# Patient Record
Sex: Female | Born: 1990 | Hispanic: Yes | Marital: Married | Smoking: Never smoker
Health system: Southern US, Community
[De-identification: ages and names within clinical notes are randomized; demographics above are authoritative.]

## PROBLEM LIST (undated history)

## (undated) DIAGNOSIS — Z789 Other specified health status: Secondary | ICD-10-CM

## (undated) HISTORY — PX: OTHER SURGICAL HISTORY: SHX169

## (undated) HISTORY — DX: Other specified health status: Z78.9

---

## 2013-08-22 ENCOUNTER — Ambulatory Visit: Payer: Self-pay

## 2016-08-23 ENCOUNTER — Emergency Department
Admission: EM | Admit: 2016-08-23 | Discharge: 2016-08-24 | Disposition: A | Discharge status: Home / Self Care | Payer: Self-pay | Attending: Emergency Medicine | Admitting: Emergency Medicine

## 2016-08-23 ENCOUNTER — Emergency Department: Payer: Self-pay

## 2016-08-23 ENCOUNTER — Encounter: Payer: Self-pay | Admitting: *Deleted

## 2016-08-23 DIAGNOSIS — D509 Iron deficiency anemia, unspecified: Secondary | ICD-10-CM | POA: Insufficient documentation

## 2016-08-23 DIAGNOSIS — R0789 Other chest pain: Secondary | ICD-10-CM | POA: Insufficient documentation

## 2016-08-23 DIAGNOSIS — Z87891 Personal history of nicotine dependence: Secondary | ICD-10-CM | POA: Insufficient documentation

## 2016-08-23 LAB — BASIC METABOLIC PANEL
ANION GAP: 7 (ref 5–15)
BUN: 17 mg/dL (ref 6–20)
CHLORIDE: 109 mmol/L (ref 101–111)
CO2: 22 mmol/L (ref 22–32)
CREATININE: 0.64 mg/dL (ref 0.44–1.00)
Calcium: 9.2 mg/dL (ref 8.9–10.3)
GFR calc non Af Amer: >60 mL/min (ref >60)
Glucose, Bld: 103 mg/dL — ABNORMAL HIGH (ref 65–99)
Potassium: 4.2 mmol/L (ref 3.5–5.1)
SODIUM: 138 mmol/L (ref 135–145)

## 2016-08-23 LAB — CBC
HCT: 30.4 % — ABNORMAL LOW (ref 35.0–47.0)
HEMOGLOBIN: 9.4 g/dL — AB (ref 12.0–16.0)
MCH: 20.4 pg — AB (ref 26.0–34.0)
MCHC: 30.9 g/dL — ABNORMAL LOW (ref 32.0–36.0)
MCV: 65.8 fL — AB (ref 80.0–100.0)
PLATELETS: 272 10*3/uL (ref 150–440)
RBC: 4.62 MIL/uL (ref 3.80–5.20)
RDW: 18.2 % — ABNORMAL HIGH (ref 11.5–14.5)
WBC: 9.8 10*3/uL (ref 3.6–11.0)

## 2016-08-23 LAB — TROPONIN I

## 2016-08-23 NOTE — ED Triage Notes (Signed)
Pt to triage via wheelchair.  Pt has chest pain and sob.  Began this afternoon.  Pt also reports nausea.   Pt tearful in triage.  Pt alert.  Interpreter with pt

## 2016-08-23 NOTE — Discharge Instructions (Signed)
Fortunately today your blood work and chest x-ray were normal. Please make an appointment to establish care with a primary care physician within the next week or so for recheck. Return to the emergency department for any new or worsening symptoms such as if your pain becomes stronger, if he developed fevers or chills, or for any other issues whatsoever.  It was a pleasure to take care of you today, and thank you for coming to our emergency department.  If you have any questions or concerns before leaving please ask the nurse to grab me and I'm more than happy to go through your aftercare instructions again.  If you were prescribed any opioid pain medication today such as Norco, Vicodin, Percocet, morphine, hydrocodone, or oxycodone please make sure you do not drive when you are taking this medication as it can alter your ability to drive safely.  If you have any concerns once you are home that you are not improving or are in fact getting worse before you can make it to your follow-up appointment, please do not hesitate to call 911 and come back for further evaluation.  Merrily BrittleNeil Dominick Morella, MD  Results for orders placed or performed during the hospital encounter of 08/23/16  Basic metabolic panel  Result Value Ref Range   Sodium 138 135 - 145 mmol/L   Potassium 4.2 3.5 - 5.1 mmol/L   Chloride 109 101 - 111 mmol/L   CO2 22 22 - 32 mmol/L   Glucose, Bld 103 (H) 65 - 99 mg/dL   BUN 17 6 - 20 mg/dL   Creatinine, Ser 3.320.64 0.44 - 1.00 mg/dL   Calcium 9.2 8.9 - 95.110.3 mg/dL   GFR calc non Af Amer >60 >60 mL/min   GFR calc Af Amer >60 >60 mL/min   Anion gap 7 5 - 15  CBC  Result Value Ref Range   WBC 9.8 3.6 - 11.0 K/uL   RBC 4.62 3.80 - 5.20 MIL/uL   Hemoglobin 9.4 (L) 12.0 - 16.0 g/dL   HCT 88.430.4 (L) 16.635.0 - 06.347.0 %   MCV 65.8 (L) 80.0 - 100.0 fL   MCH 20.4 (L) 26.0 - 34.0 pg   MCHC 30.9 (L) 32.0 - 36.0 g/dL   RDW 01.618.2 (H) 01.011.5 - 93.214.5 %   Platelets 272 150 - 440 K/uL  Troponin I  Result Value Ref  Range   Troponin I <0.03 <0.03 ng/mL   Dg Chest 2 View  Result Date: 08/23/2016 CLINICAL DATA:  Acute onset of generalized chest pain, shortness of breath and nausea. Initial encounter. EXAM: CHEST  2 VIEW COMPARISON:  None. FINDINGS: The lungs are well-aerated and clear. There is no evidence of focal opacification, pleural effusion or pneumothorax. The heart is normal in size; the mediastinal contour is within normal limits. No acute osseous abnormalities are seen. IMPRESSION: No acute cardiopulmonary process seen. Electronically Signed   By: Roanna RaiderJeffery  Chang M.D.   On: 08/23/2016 23:31

## 2016-08-23 NOTE — ED Provider Notes (Signed)
[facility] Emergency Department Provider Note  ____________________________________________   First MD Initiated Contact with Patient 08/23/16 2302     (approximate)  I have reviewed the triage vital signs and the nursing notes.   HISTORY  Chief Complaint Chest Pain   HPI Leah Ayers is a 26 y.o. female who self presents to the emergency department withatypical chest pain that began roughly 2 hours prior to arrival at 7:30 PM today. The pain began when she was at work. The pain is sharp in her left lateral chest and worse with deep inspiration. It is nonexertional. She's had no cough. She did have a mild sore throat this morning. No fevers or chills. No leg swelling. No history of DVT or pulmonary embolism. No sick contacts. She is currently in no pain.   No past medical history on file.  There are no active problems to display for this patient.   No past surgical history on file.  Prior to Admission medications   Not on File    Allergies Patient has no known allergies.  No family history on file.  Social History Social History  Substance Use Topics  . Smoking status: Former Games developer  . Smokeless tobacco: Never Used  . Alcohol use No    Review of Systems Constitutional: No fever/chills ENT: Positive sore throat. Cardiovascular: Positive chest pain. Respiratory: Positive shortness of breath. Gastrointestinal: No abdominal pain.  No nausea, no vomiting.  No diarrhea.  No constipation. Musculoskeletal: Negative for back pain. Neurological: Negative for headaches   ____________________________________________   PHYSICAL EXAM:  VITAL SIGNS: ED Triage Vitals  Enc Vitals Group     BP 08/23/16 2131 (!) 100/57     Pulse Rate 08/23/16 2131 76     Resp 08/23/16 2131 20     Temp 08/23/16 2131 99.1 F (37.3 C)     Temp Source 08/23/16 2131 Oral     SpO2 08/23/16 2131 99 %     Weight 08/23/16 2127 120 lb (54.4 kg)     Height  08/23/16 2127 5\' 4"  (1.626 m)     Head Circumference --      Peak Flow --      Pain Score 08/23/16 2126 8     Pain Loc --      Pain Edu? --      Excl. in GC? --     Constitutional: Alert and oriented 4 very well-appearing nontoxic no diaphoresis speaks in full clear sentences Head: Atraumatic. Nose: No congestion/rhinnorhea. Mouth/Throat: No trismus no pharyngeal erythema or exudate Neck: No stridor.   Cardiovascular: Regular rate and rhythm no murmurs focally tender over left upper chest is Respiratory: Normal respiratory effort.  No retractions. Gastrointestinal: Soft nondistended nontender no rebound or guarding no peritonitis Neurologic:  Normal speech and language. No gross focal neurologic deficits are appreciated.  Skin:  Skin is warm, dry and intact. No rash noted.    ____________________________________________  LABS (all labs ordered are listed, but only abnormal results are displayed)  Labs Reviewed  BASIC METABOLIC PANEL - Abnormal; Notable for the following:       Result Value   Glucose, Bld 103 (*)    All other components within normal limits  CBC - Abnormal; Notable for the following:    Hemoglobin 9.4 (*)    HCT 30.4 (*)    MCV 65.8 (*)    MCH 20.4 (*)    MCHC 30.9 (*)    RDW 18.2 (*)  All other components within normal limits  TROPONIN I    No signs of acute ischemia Slightly low hemoglobin with low MCV and high RDW consistent with iron deficiency __________________________________________  EKG  ED ECG REPORT I, Merrily BrittleNeil Samyak Sackmann, the attending physician, personally viewed and interpreted this ECG.  Date: 08/23/2016 Rate: 90 Rhythm: normal sinus rhythm QRS Axis: Rightward axis Intervals: normal ST/T Wave abnormalities: normal Narrative Interpretation: Borderline EKG  ____________________________________________  RADIOLOGY  Chest x-ray with no acute disease ____________________________________________   PROCEDURES  Procedure(s)  performed: no  Procedures  Critical Care performed: no  Observation: no ____________________________________________   INITIAL IMPRESSION / ASSESSMENT AND PLAN / ED COURSE  Pertinent labs & imaging results that were available during my care of the patient were reviewed by me and considered in my medical decision making (see chart for details).  The patient is very well-appearing and currently in no pain. She did have some pleuritic chest pain and shortness of breath with a slightly rightward axis which raises the concern for pulmonary embolism however she is low risk and she is PERC negative.  Her hemoglobin is 10 with a low MCV and high RDW which is consistent with iron deficiency and likely secondary to heavy menses. I'll refer her to trust her clinic to establish care with a primary care physician and strict return precautions given. She is discharged home in good condition.      ____________________________________________   FINAL CLINICAL IMPRESSION(S) / ED DIAGNOSES  Final diagnoses:  Atypical chest pain  Iron deficiency anemia, unspecified iron deficiency anemia type      NEW MEDICATIONS STARTED DURING THIS VISIT:  New Prescriptions   No medications on file     Note:  This document was prepared using Dragon voice recognition software and may include unintentional dictation errors.      Merrily Brittleifenbark, Mcgwire Dasaro, MD 08/23/16 2351

## 2018-10-17 IMAGING — CR DG CHEST 2V
2 series · 2 of 2 positions shown · non-contrast
Comparison: None.

CLINICAL DATA: Acute onset of generalized chest pain, shortness of
breath and nausea. Initial encounter.

EXAM:
CHEST  2 VIEW

[chest pa]
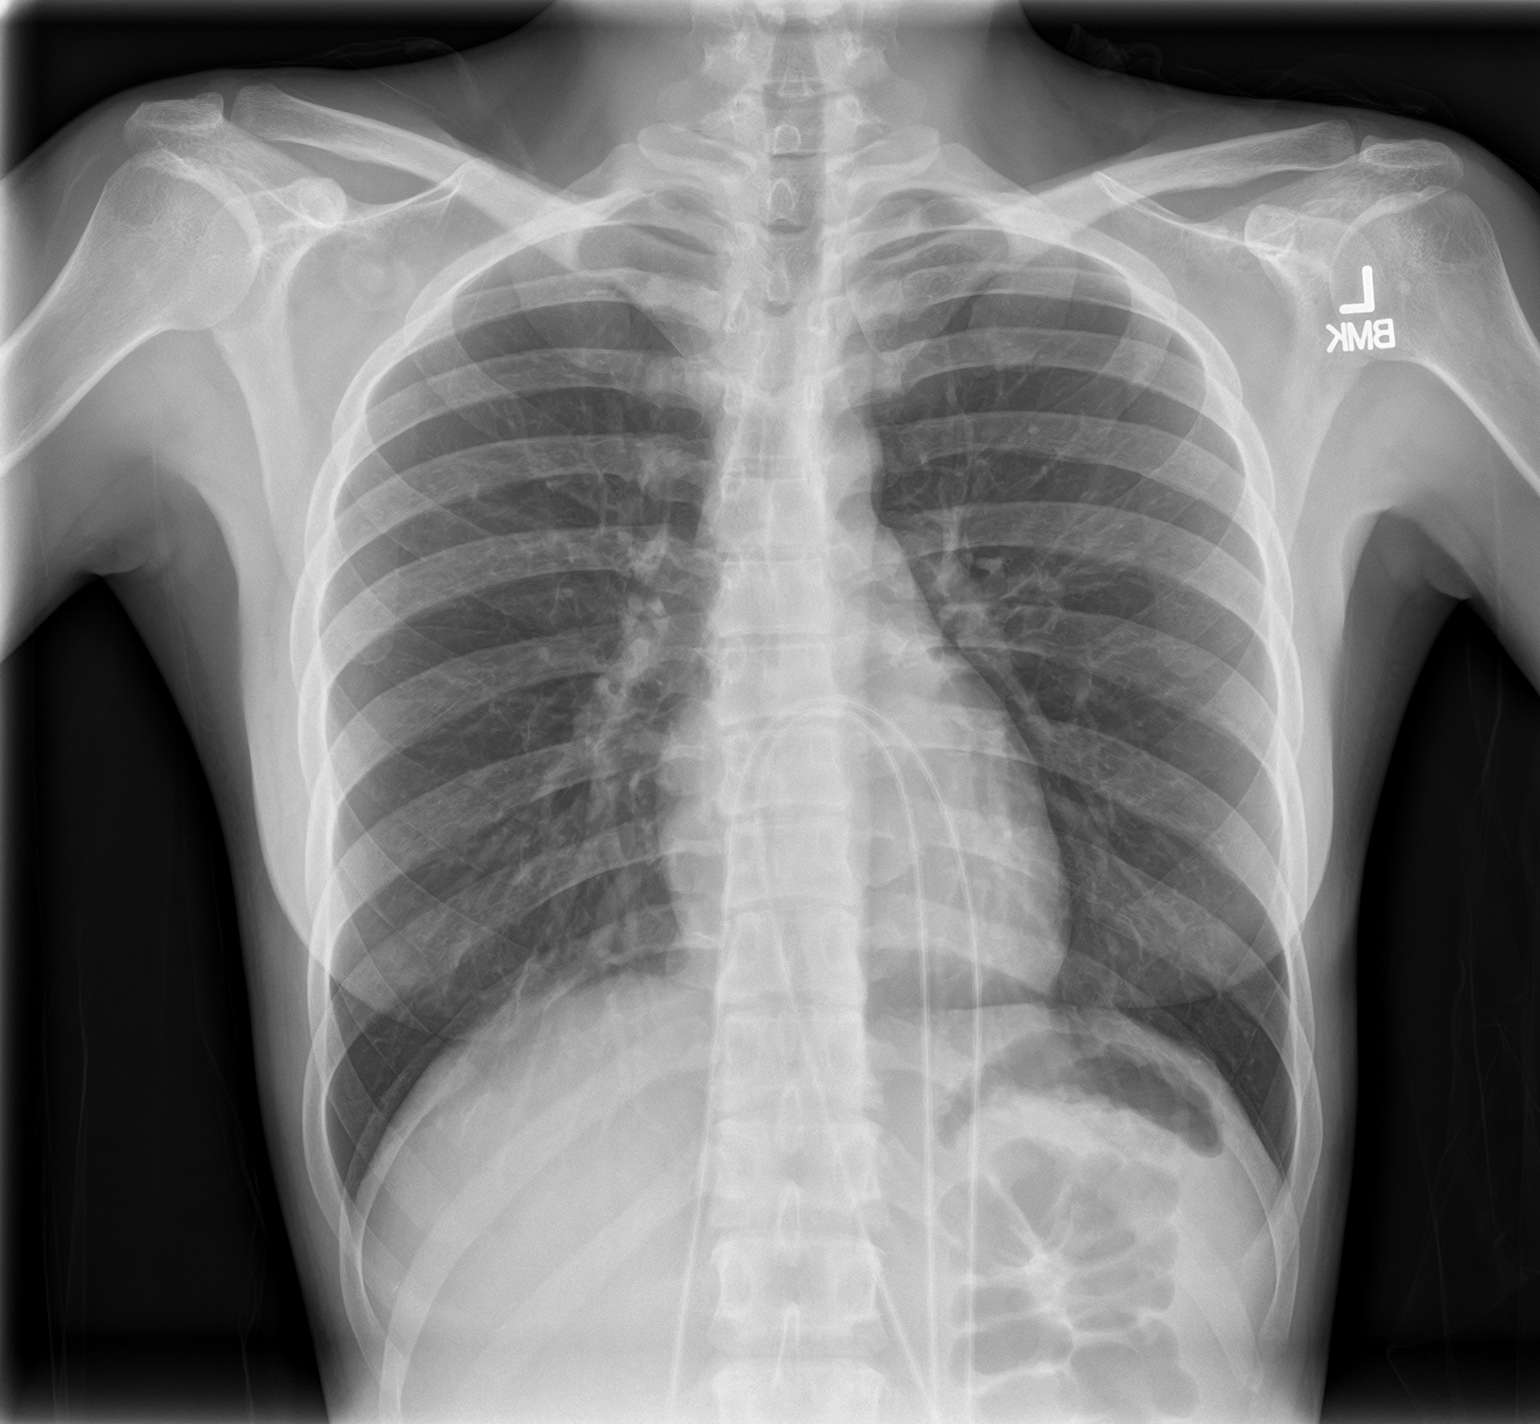

[chest lat]
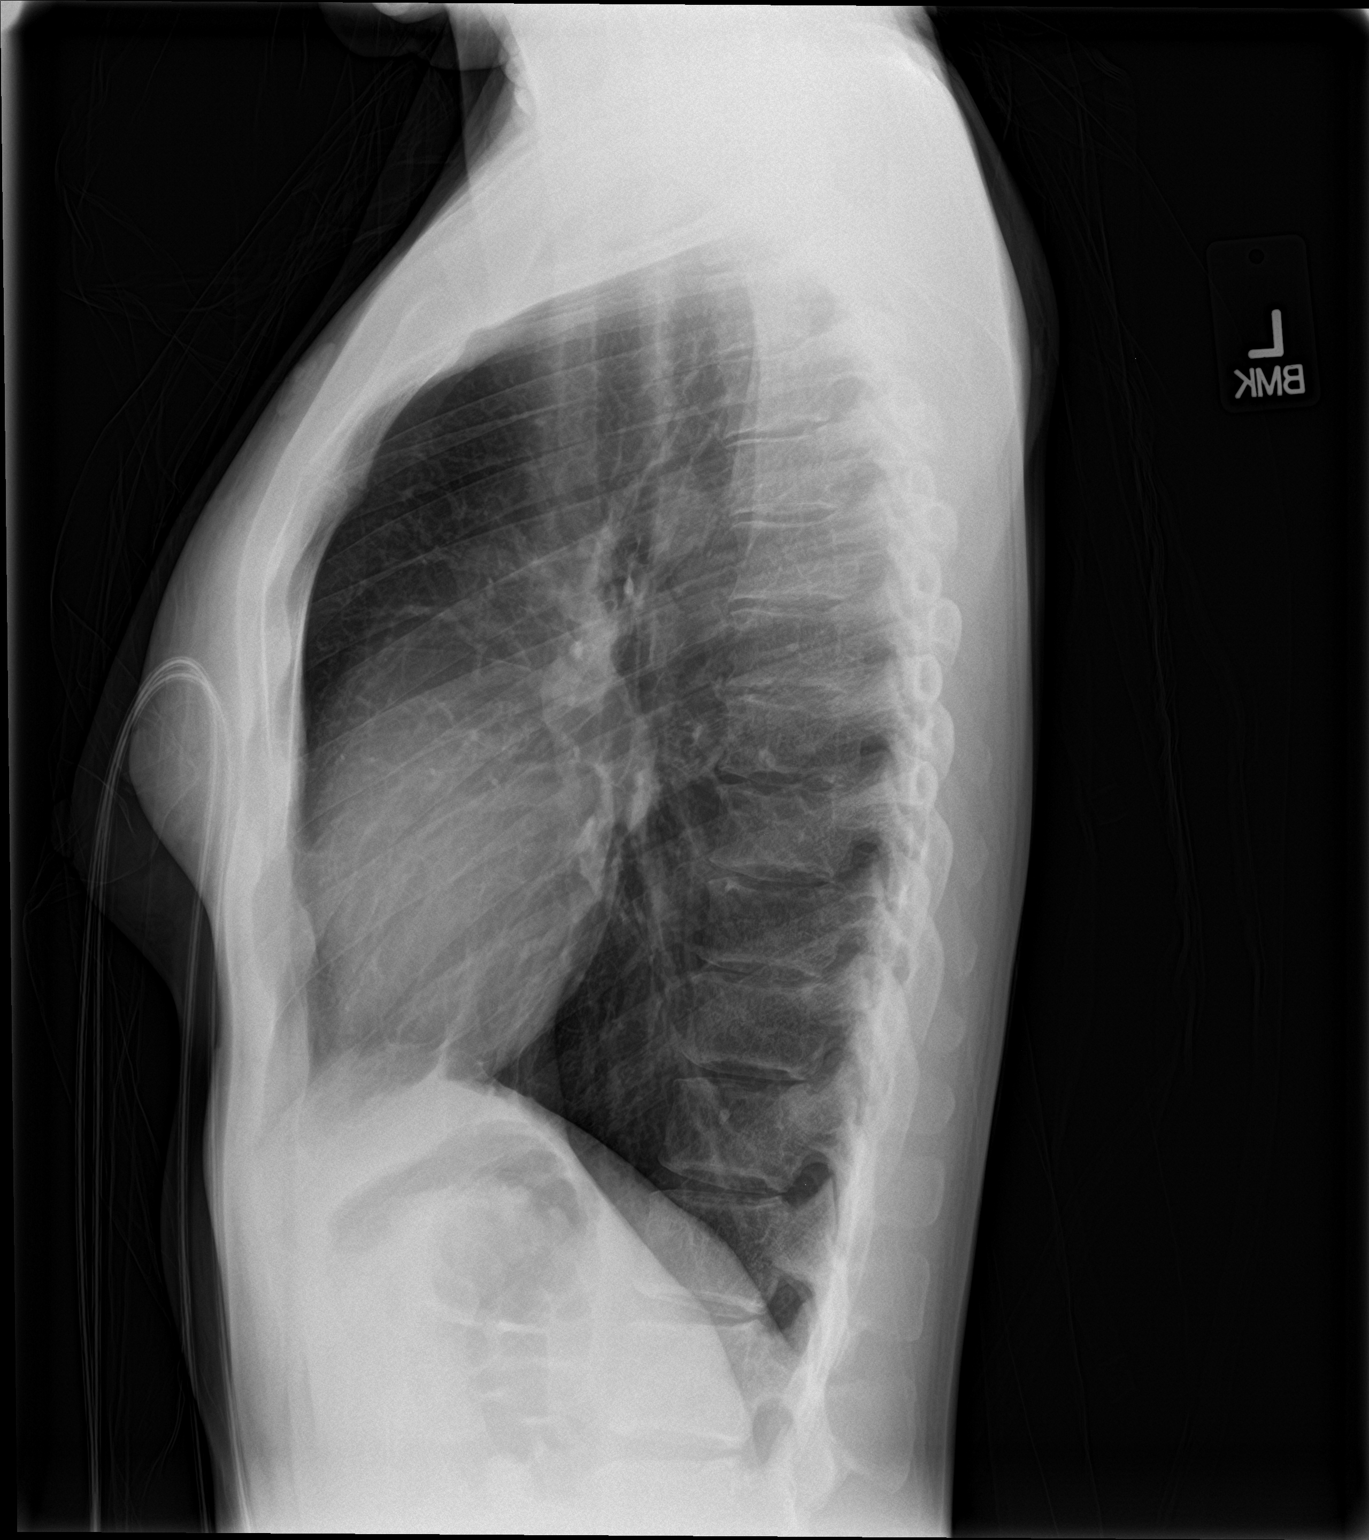

[2 of 2 positions shown; findings below may reference images not displayed]

FINDINGS: The lungs are well-aerated and clear. There is no evidence of focal
opacification, pleural effusion or pneumothorax.

The heart is normal in size; the mediastinal contour is within
normal limits. No acute osseous abnormalities are seen.
IMPRESSION: No acute cardiopulmonary process seen.

## 2019-11-21 ENCOUNTER — Other Ambulatory Visit: Payer: Self-pay

## 2019-11-21 ENCOUNTER — Ambulatory Visit (LOCAL_COMMUNITY_HEALTH_CENTER): Payer: Medicaid Other

## 2019-11-21 VITALS — BP 105/59 | Ht 62.0 in | Wt 106.5 lb

## 2019-11-21 DIAGNOSIS — Z3202 Encounter for pregnancy test, result negative: Secondary | ICD-10-CM

## 2019-11-21 LAB — PREGNANCY, URINE: Preg Test, Ur: NEGATIVE

## 2019-11-21 NOTE — Progress Notes (Signed)
UPT negative today. Sister present during visit. Lang. Line used as interpreter. Reports trying to conceive for 11 yrs. Home preg test positve. Reports n/v, abd pain. Denies bleeding. Taking mvi. LMP 11/03/2019.  Advised pt to follow up with PCP to evaluate symptoms. Local community provider list given and explained. Advised to seek immediate medical attn. For increased abd pain and bleeding. Pt in agreement. Questions answered and reports understanding. Jerel Shepherd, RN

## 2020-06-12 ENCOUNTER — Ambulatory Visit (LOCAL_COMMUNITY_HEALTH_CENTER): Payer: Medicaid Other

## 2020-06-12 ENCOUNTER — Other Ambulatory Visit: Payer: Self-pay

## 2020-06-12 VITALS — BP 107/59 | Wt 110.0 lb

## 2020-06-12 DIAGNOSIS — Z3201 Encounter for pregnancy test, result positive: Secondary | ICD-10-CM | POA: Diagnosis not present

## 2020-06-12 LAB — PREGNANCY, URINE: Preg Test, Ur: POSITIVE — AB

## 2020-06-12 MED ORDER — PRENATAL 27-0.8 MG PO TABS: 1.0000 | ORAL_TABLET | Freq: Every day | ORAL | 0 refills | Status: AC

## 2020-06-12 NOTE — Progress Notes (Signed)
UPT positive. Pt states she has been trying for pregnancy for 11 years.  Plans prenatal care at [facility]. No [facility] record. To clerk for preadmit. Juliene Pina, interpreter. Jerel Shepherd, RN

## 2020-06-13 ENCOUNTER — Emergency Department: Payer: Medicaid Other

## 2020-06-13 ENCOUNTER — Emergency Department
Admission: EM | Admit: 2020-06-13 | Discharge: 2020-06-13 | Disposition: A | Discharge status: Home / Self Care | Payer: Medicaid Other | Attending: Emergency Medicine | Admitting: Emergency Medicine

## 2020-06-13 ENCOUNTER — Other Ambulatory Visit: Payer: Self-pay

## 2020-06-13 DIAGNOSIS — Z87891 Personal history of nicotine dependence: Secondary | ICD-10-CM | POA: Insufficient documentation

## 2020-06-13 DIAGNOSIS — R1030 Lower abdominal pain, unspecified: Secondary | ICD-10-CM | POA: Insufficient documentation

## 2020-06-13 DIAGNOSIS — O208 Other hemorrhage in early pregnancy: Secondary | ICD-10-CM | POA: Diagnosis not present

## 2020-06-13 DIAGNOSIS — O4690 Antepartum hemorrhage, unspecified, unspecified trimester: Secondary | ICD-10-CM

## 2020-06-13 DIAGNOSIS — Z3A01 Less than 8 weeks gestation of pregnancy: Secondary | ICD-10-CM | POA: Insufficient documentation

## 2020-06-13 DIAGNOSIS — D509 Iron deficiency anemia, unspecified: Secondary | ICD-10-CM | POA: Diagnosis not present

## 2020-06-13 LAB — COMPREHENSIVE METABOLIC PANEL
ALT: 14 U/L (ref 0–44)
AST: 17 U/L (ref 15–41)
Albumin: 4.3 g/dL (ref 3.5–5.0)
Alkaline Phosphatase: 47 U/L (ref 38–126)
Anion gap: 7 (ref 5–15)
BUN: 18 mg/dL (ref 6–20)
CO2: 21 mmol/L — ABNORMAL LOW (ref 22–32)
Calcium: 8.8 mg/dL — ABNORMAL LOW (ref 8.9–10.3)
Chloride: 109 mmol/L (ref 98–111)
Creatinine, Ser: 0.54 mg/dL (ref 0.44–1.00)
GFR, Estimated: >60 mL/min (ref >60)
Glucose, Bld: 92 mg/dL (ref 70–99)
Potassium: 3.4 mmol/L — ABNORMAL LOW (ref 3.5–5.1)
Sodium: 137 mmol/L (ref 135–145)
Total Bilirubin: 1 mg/dL (ref 0.3–1.2)
Total Protein: 7.3 g/dL (ref 6.5–8.1)

## 2020-06-13 LAB — URINALYSIS, COMPLETE (UACMP) WITH MICROSCOPIC
Bacteria, UA: NONE SEEN
Bilirubin Urine: NEGATIVE
Glucose, UA: NEGATIVE mg/dL
Ketones, ur: NEGATIVE mg/dL
Leukocytes,Ua: NEGATIVE
Nitrite: NEGATIVE
Protein, ur: NEGATIVE mg/dL
Specific Gravity, Urine: 1.023 (ref 1.005–1.030)
pH: 8 (ref 5.0–8.0)

## 2020-06-13 LAB — CBC WITH DIFFERENTIAL/PLATELET
Abs Immature Granulocytes: 0.02 10*3/uL (ref 0.00–0.07)
Basophils Absolute: 0.1 10*3/uL (ref 0.0–0.1)
Basophils Relative: 1 %
Eosinophils Absolute: 0.2 10*3/uL (ref 0.0–0.5)
Eosinophils Relative: 2 %
HCT: 25 % — ABNORMAL LOW (ref 36.0–46.0)
Hemoglobin: 7.3 g/dL — ABNORMAL LOW (ref 12.0–15.0)
Immature Granulocytes: 0 %
Lymphocytes Relative: 27 %
Lymphs Abs: 2.3 10*3/uL (ref 0.7–4.0)
MCH: 18.7 pg — ABNORMAL LOW (ref 26.0–34.0)
MCHC: 29.2 g/dL — ABNORMAL LOW (ref 30.0–36.0)
MCV: 63.9 fL — ABNORMAL LOW (ref 80.0–100.0)
Monocytes Absolute: 0.5 10*3/uL (ref 0.1–1.0)
Monocytes Relative: 6 %
Neutro Abs: 5.4 10*3/uL (ref 1.7–7.7)
Neutrophils Relative %: 64 %
Platelets: 300 10*3/uL (ref 150–400)
RBC: 3.91 MIL/uL (ref 3.87–5.11)
RDW: 18 % — ABNORMAL HIGH (ref 11.5–15.5)
Smear Review: NORMAL
WBC: 8.5 10*3/uL (ref 4.0–10.5)
nRBC: 0 % (ref 0.0–0.2)

## 2020-06-13 LAB — PREGNANCY, URINE: Preg Test, Ur: POSITIVE — AB

## 2020-06-13 LAB — ABO/RH: ABO/RH(D): O POS

## 2020-06-13 LAB — HCG, QUANTITATIVE, PREGNANCY: hCG, Beta Chain, Quant, S: 498 m[IU]/mL — ABNORMAL HIGH (ref <5)

## 2020-06-13 MED ORDER — FERROUS SULFATE 325 (65 FE) MG PO TABS: 325.0000 mg | ORAL_TABLET | Freq: Two times a day (BID) | ORAL | 0 refills | Status: DC

## 2020-06-13 NOTE — ED Notes (Signed)
Patient transported to Ultrasound 

## 2020-06-13 NOTE — ED Provider Notes (Signed)
[facility] Emergency Department Provider Note  ____________________________________________  Time seen: Approximately 9:01 PM  I have reviewed the triage vital signs and the nursing notes.   HISTORY  Chief Complaint Vaginal Bleeding    HPI Leah Ayers is a 30 y.o. female who presents the emergency department complaining of lower abdominal pain and vaginal bleeding.  Patient states that she is taken a pregnancy test which was positive and she thinks that she is somewhere between 2 to [redacted] weeks pregnant.  Patient started having some cramping in the lower abdomen and developed some spotting/vaginal bleeding today.  Patient denies any fevers or chills, nausea vomiting, diarrhea or constipation.  No dysuria, polyuria, hematuria.  Patient denies any other vaginal discharge.  Patient has started prenatal vitamins.  She has had contact with her OB/GYN but has not seen an OB/GYN at this time.         Past Medical History:  Diagnosis Date  . Patient denies medical problems     There are no problems to display for this patient.   Past Surgical History:  Procedure Laterality Date  . denies      Prior to Admission medications   Medication Sig Start Date End Date Taking? Authorizing Provider  ferrous sulfate 325 (65 FE) MG tablet Take 1 tablet (325 mg total) by mouth 2 (two) times daily with a meal. 06/13/20 06/13/21 Yes Kattia Selley, Delorise Royals, PA-C  Multiple Vitamin (MULTIVITAMIN) tablet Take 1 tablet by mouth daily. Patient not taking: Reported on 06/12/2020    [provider]  Prenatal Vit-Fe Fumarate-FA (MULTIVITAMIN-PRENATAL) 27-0.8 MG TABS tablet Take 1 tablet by mouth daily at 12 noon. 06/12/20 09/20/20  Federico Flake, MD    Allergies Patient has no known allergies.  No family history on file.  Social History Social History   Tobacco Use  . Smoking status: Former Smoker    Quit date: 01/18/2014    Years since quitting: 6.4  .  Smokeless tobacco: Never Used  Vaping Use  . Vaping Use: Never used  Substance Use Topics  . Alcohol use: Not Currently    Alcohol/week: 1.0 standard drink    Types: 1 Cans of beer per week    Comment: last drink 8 days ago  . Drug use: No     Review of Systems  Constitutional: No fever/chills Eyes: No visual changes. No discharge ENT: No upper respiratory complaints. Cardiovascular: no chest pain. Respiratory: no cough. No SOB. Gastrointestinal: No abdominal pain.  No nausea, no vomiting.  No diarrhea.  No constipation. Genitourinary: Negative for dysuria. No hematuria.  Vaginal bleeding/spotting in the setting of positive pregnancy test Musculoskeletal: Negative for musculoskeletal pain. Skin: Negative for rash, abrasions, lacerations, ecchymosis. Neurological: Negative for headaches, focal weakness or numbness.  10 System ROS otherwise negative.  ____________________________________________   PHYSICAL EXAM:  VITAL SIGNS: ED Triage Vitals  Enc Vitals Group     BP 06/13/20 1927 109/68     Pulse Rate 06/13/20 1927 70     Resp 06/13/20 2058 16     Temp 06/13/20 1927 98.4 F (36.9 C)     Temp Source 06/13/20 1927 Oral     SpO2 06/13/20 1927 100 %     Weight 06/13/20 1927 111 lb (50.3 kg)     Height 06/13/20 1937 5\' 4"  (1.626 m)     Head Circumference --      Peak Flow --      Pain Score 06/13/20 1936 4  Pain Loc --      Pain Edu? --      Excl. in GC? --      Constitutional: Alert and oriented. Well appearing and in no acute distress. Eyes: Conjunctivae are normal. PERRL. EOMI. Head: Atraumatic. ENT:      Ears:       Nose: No congestion/rhinnorhea.      Mouth/Throat: Mucous membranes are moist.  Neck: No stridor.    Cardiovascular: Normal rate, regular rhythm. Normal S1 and S2.  Good peripheral circulation. Respiratory: Normal respiratory effort without tachypnea or retractions. Lungs CTAB. Good air entry to the bases with no decreased or absent breath  sounds. Gastrointestinal: Visualization of the abdominal wall reveals no significant findings.  Bowel sounds 4 quadrants.  Soft to palpation all quadrants.  Patient is mildly tender to palpation in the suprapubic region otherwise no tenderness.  No guarding or rigidity. No palpable masses. No distention. No CVA tenderness. Genitourinary: Pelvic exam deferred at this time Musculoskeletal: Full range of motion to all extremities. No gross deformities appreciated. Neurologic:  Normal speech and language. No gross focal neurologic deficits are appreciated.  Skin:  Skin is warm, dry and intact. No rash noted. Psychiatric: Mood and affect are normal. Speech and behavior are normal. Patient exhibits appropriate insight and judgement.   ____________________________________________   LABS (all labs ordered are listed, but only abnormal results are displayed)  Labs Reviewed  PREGNANCY, URINE - Abnormal; Notable for the following components:      Result Value   Preg Test, Ur POSITIVE (*)    All other components within normal limits  HCG, QUANTITATIVE, PREGNANCY - Abnormal; Notable for the following components:   hCG, Beta Chain, Quant, S 498 (*)    All other components within normal limits  COMPREHENSIVE METABOLIC PANEL - Abnormal; Notable for the following components:   Potassium 3.4 (*)    CO2 21 (*)    Calcium 8.8 (*)    All other components within normal limits  CBC WITH DIFFERENTIAL/PLATELET - Abnormal; Notable for the following components:   Hemoglobin 7.3 (*)    HCT 25.0 (*)    MCV 63.9 (*)    MCH 18.7 (*)    MCHC 29.2 (*)    RDW 18.0 (*)    All other components within normal limits  URINALYSIS, COMPLETE (UACMP) WITH MICROSCOPIC - Abnormal; Notable for the following components:   Color, Urine YELLOW (*)    APPearance CLEAR (*)    Hgb urine dipstick LARGE (*)    All other components within normal limits  ABO/RH    ____________________________________________  EKG   ____________________________________________  RADIOLOGY I personally viewed and evaluated these images as part of my medical decision making, as well as reviewing the written report by the radiologist.  ED Provider Interpretation: No identifiable intrauterine pregnancy on ultrasound.  This may indicate miscarriage, no conception versus too early for visualization or ectopic.  Recommended to follow serial hCG and repeat ultrasound if necessary.  US OB LESS THAN 14 WEEKS WITH OB TRANSVAGINAL  Result Date: 06/13/2020 CLINICAL DATA:  Bleeding, quantitative beta hCG = 498 EXAM: OBSTETRIC <14 WK US AND TRANSVAGINAL OB US TECHNIQUE: Both transabdominal and transvaginal ultrasound examinations were performed for complete evaluation of the gestation as well as the maternal uterus, adnexal regions, and pelvic cul-de-sac. Transvaginal technique was performed to assess early pregnancy. COMPARISON:  None. FINDINGS: Intrauterine gestational sac: None Yolk sac:  Not Visualized. Embryo:  Not Visualized. Maternal uterus/adnexae: Anteverted maternal uterus. Some  mild endometrial thickening is noted which could reflect some early decidual reaction though no discrete endometrial fluid collection or intrauterine gestational sac is identified. Normal appearance of the right ovary. Probable corpus luteum in the left ovary. Additional anechoic follicles in the left ovary as well. No discrete lesions suggestive of ectopic is seen within either adnexa. Small volume of anechoic fluid in the deep pelvis is nonspecific and often physiologic. IMPRESSION: No intrauterine pregnancy visualized. Endometrial thickening could reflect some early decidual reaction. Differential considerations would include early intrauterine pregnancy too early to visualize, spontaneous abortion, or occult ectopic pregnancy. Recommend close clinical followup and serial quantitative beta HCGs and  ultrasounds. Small volume of anechoic free fluid in the deep pelvis is nonspecific and often physiologic in a reproductive age female. Electronically Signed   By: Kreg Shropshire M.D.   On: 06/13/2020 22:44    ____________________________________________    PROCEDURES  Procedure(s) performed:    Procedures    Medications - No data to display   ____________________________________________   INITIAL IMPRESSION / ASSESSMENT AND PLAN / ED COURSE  Pertinent labs & imaging results that were available during my care of the patient were reviewed by me and considered in my medical decision making (see chart for details).  Review of the [place] CSRS was performed in accordance of the [facility] prior to dispensing any controlled drugs.           Patient's diagnosis is consistent with vaginal bleeding in pregnancy, iron deficiency anemia.  Patient presented to the emergency department with some spotting today.  Patient had recently had a positive home pregnancy test.  She had informed her OB/GYN but had not seen OB/GYN at this time.  Last menstrual cycle was a month ago so by miscalculation patient was likely [redacted] weeks pregnant.  This was within the hCG range as well.  Ultrasound did not reveal an intrauterine pregnancy but again had 2 weeks likely gestation this is not to be unexpected.  However this is unable to confirm intrauterine pregnancy versus miscarriage versus ectopic.  It is recommended that patient have serial hCGs to trend and follow patient's symptoms to determine whether further imaging is needed.  Patient was also found to be anemic at a hemoglobin of 7.3.  Given her pregnancy status this time I feel that patient would be best suited with continuing oral prenatal vitamins as well as oral iron supplementation at home.  She is already to be seen in 2 days to follow-up with hCG we will repeat CBC as well to ensure we are appropriately following her hemoglobin levels.  Patient is informed of the  results of the work-up and our plan.  She is agreeable with the plan and states that she will return in 2 days for repeat hCG..  Patient is encouraged to return sooner if she has any concerning signs and symptoms.  She will also work on establishing care with OB/GYN.  Patient is given ED precautions to return to the ED for any worsening or new symptoms.     ____________________________________________  FINAL CLINICAL IMPRESSION(S) / ED DIAGNOSES  Final diagnoses:  Vaginal bleeding in pregnancy  Iron deficiency anemia, unspecified iron deficiency anemia type      NEW MEDICATIONS STARTED DURING THIS VISIT:  ED Discharge Orders         Ordered    ferrous sulfate 325 (65 FE) MG tablet  2 times daily with meals        06/13/20 2319  This chart was dictated using voice recognition software/Dragon. Despite best efforts to proofread, errors can occur which can change the meaning. Any change was purely unintentional.    Racheal Patches, PA-C 06/14/20 0024    Shaune Pollack, MD 06/19/20 [phone]

## 2020-06-13 NOTE — ED Notes (Signed)
Interpreter used for H&P and assessment. Patient reports states she is approximately [redacted] weeks pregnant (LMP - 05/13/20), and has had some lower abdominal and lower back pain x 2 days. Patient reports dark red spotting x several days as well.

## 2020-06-13 NOTE — ED Triage Notes (Signed)
Interpreting system used during triage. Pt sitting calmly in chair; resp reg/unlabored; skin dry. Pt in from home d/t vaginal bleeding; states is [redacted] weeks pregnant. Reports pink and then dark bleeding; states it is "in small amounts but frequent". Small amount of nausea per pt. Reports a little cramping.

## 2020-06-13 NOTE — ED Notes (Signed)
Patient provided an update via interpreter. Labs collected. Patient provided urine cup for further urine collection. Patient verbalized understanding.

## 2020-06-13 NOTE — ED Notes (Signed)
lav and grn tubes sent to lab. Pt given urine specimen cup.

## 2020-06-15 ENCOUNTER — Encounter: Payer: Self-pay | Admitting: Intensive Care

## 2020-06-15 ENCOUNTER — Other Ambulatory Visit: Payer: Self-pay

## 2020-06-15 ENCOUNTER — Emergency Department
Admission: EM | Admit: 2020-06-15 | Discharge: 2020-06-15 | Disposition: A | Discharge status: Home / Self Care | Payer: Medicaid Other | Attending: Emergency Medicine | Admitting: Emergency Medicine

## 2020-06-15 DIAGNOSIS — Z3A01 Less than 8 weeks gestation of pregnancy: Secondary | ICD-10-CM | POA: Insufficient documentation

## 2020-06-15 DIAGNOSIS — O26891 Other specified pregnancy related conditions, first trimester: Secondary | ICD-10-CM | POA: Insufficient documentation

## 2020-06-15 DIAGNOSIS — O4690 Antepartum hemorrhage, unspecified, unspecified trimester: Secondary | ICD-10-CM

## 2020-06-15 DIAGNOSIS — N939 Abnormal uterine and vaginal bleeding, unspecified: Secondary | ICD-10-CM | POA: Insufficient documentation

## 2020-06-15 DIAGNOSIS — Z87891 Personal history of nicotine dependence: Secondary | ICD-10-CM | POA: Diagnosis not present

## 2020-06-15 LAB — CBC
HCT: 27.9 % — ABNORMAL LOW (ref 36.0–46.0)
Hemoglobin: 8.2 g/dL — ABNORMAL LOW (ref 12.0–15.0)
MCH: 18.4 pg — ABNORMAL LOW (ref 26.0–34.0)
MCHC: 29.4 g/dL — ABNORMAL LOW (ref 30.0–36.0)
MCV: 62.6 fL — ABNORMAL LOW (ref 80.0–100.0)
Platelets: 328 10*3/uL (ref 150–400)
RBC: 4.46 MIL/uL (ref 3.87–5.11)
RDW: 18.3 % — ABNORMAL HIGH (ref 11.5–15.5)
WBC: 7.4 10*3/uL (ref 4.0–10.5)
nRBC: 0 % (ref 0.0–0.2)

## 2020-06-15 LAB — HCG, QUANTITATIVE, PREGNANCY: hCG, Beta Chain, Quant, S: 241 m[IU]/mL — ABNORMAL HIGH (ref <5)

## 2020-06-15 NOTE — ED Triage Notes (Addendum)
Interpretor used during triage. Patient here today for repeat HCG lab draw. Reports pelvic pain and some vaginal bleeding

## 2020-06-15 NOTE — Discharge Instructions (Addendum)
Your pregnancy hormones indicate a miscarriage. You might experience pelvic cramping and vaginal bleeding at home. Please make a follow-up appointment with your OB/GYN. You can take Tylenol of discomfort.

## 2020-06-15 NOTE — ED Provider Notes (Signed)
[facility]-EMERGENCY DEPARTMENT  ____________________________________________  Time seen: Approximately 6:13 PM  I have reviewed the triage vital signs and the nursing notes.   HISTORY  Chief Complaint Threatened Miscarriage   Historian Patient     HPI Leah Ayers is a 30 y.o. female presents to the emergency department for repeat beta-hCG given instructions from ED encounter 3 days ago.  Patient reports persistent pelvic cramping and vaginal bleeding at home.  No chest pain, chest tightness or abdominal pain.   Past Medical History:  Diagnosis Date  . Patient denies medical problems      Immunizations up to date:  Yes.     Past Medical History:  Diagnosis Date  . Patient denies medical problems     There are no problems to display for this patient.   Past Surgical History:  Procedure Laterality Date  . denies      Prior to Admission medications   Medication Sig Start Date End Date Taking? Authorizing Provider  ferrous sulfate 325 (65 FE) MG tablet Take 1 tablet (325 mg total) by mouth 2 (two) times daily with a meal. 06/13/20 06/13/21  Cuthriell, Delorise Royals, PA-C  Multiple Vitamin (MULTIVITAMIN) tablet Take 1 tablet by mouth daily. Patient not taking: Reported on 06/12/2020    [provider]  Prenatal Vit-Fe Fumarate-FA (MULTIVITAMIN-PRENATAL) 27-0.8 MG TABS tablet Take 1 tablet by mouth daily at 12 noon. 06/12/20 09/20/20  Federico Flake, MD    Allergies Patient has no known allergies.  History reviewed. No pertinent family history.  Social History Social History   Tobacco Use  . Smoking status: Former Smoker    Quit date: 01/18/2014    Years since quitting: 6.4  . Smokeless tobacco: Never Used  Vaping Use  . Vaping Use: Never used  Substance Use Topics  . Alcohol use: Not Currently    Alcohol/week: 1.0 standard drink    Types: 1 Cans of beer per week    Comment: last drink 8 days ago  . Drug use: No     Review of Systems   Constitutional: No fever/chills Eyes:  No discharge ENT: No upper respiratory complaints. Respiratory: no cough. No SOB/ use of accessory muscles to breath Gastrointestinal:   No nausea, no vomiting.  No diarrhea.  No constipation. Genitourinary: Patient has vaginal bleeding and cramping.  Musculoskeletal: Negative for musculoskeletal pain. Skin: Negative for rash, abrasions, lacerations, ecchymosis.   ____________________________________________   PHYSICAL EXAM:  VITAL SIGNS: ED Triage Vitals  Enc Vitals Group     BP 06/15/20 1611 121/74     Pulse Rate 06/15/20 1611 100     Resp 06/15/20 1611 16     Temp 06/15/20 1611 98.2 F (36.8 C)     Temp Source 06/15/20 1611 Oral     SpO2 06/15/20 1611 100 %     Weight 06/15/20 1612 111 lb (50.3 kg)     Height 06/15/20 1612 5\' 4"  (1.626 m)     Head Circumference --      Peak Flow --      Pain Score 06/15/20 1612 4     Pain Loc --      Pain Edu? --      Excl. in GC? --      Constitutional: Alert and oriented. Well appearing and in no acute distress. Eyes: Conjunctivae are normal. PERRL. EOMI. Head: Atraumatic. ENT: Cardiovascular: Normal rate, regular rhythm. Normal S1 and S2.  Good peripheral circulation. Respiratory: Normal respiratory effort without tachypnea or retractions.  Lungs CTAB. Good air entry to the bases with no decreased or absent breath sounds Gastrointestinal: Bowel sounds x 4 quadrants. Soft and nontender to palpation. No guarding or rigidity. No distention. Musculoskeletal: Full range of motion to all extremities. No obvious deformities noted Neurologic:  Normal for age. No gross focal neurologic deficits are appreciated.  Skin:  Skin is warm, dry and intact. No rash noted. Psychiatric: Mood and affect are normal for age. Speech and behavior are normal.   ____________________________________________   LABS (all labs ordered are listed, but only abnormal results are displayed)  Labs Reviewed  HCG,  QUANTITATIVE, PREGNANCY - Abnormal; Notable for the following components:      Result Value   hCG, Beta Chain, Quant, S 241 (*)    All other components within normal limits  CBC - Abnormal; Notable for the following components:   Hemoglobin 8.2 (*)    HCT 27.9 (*)    MCV 62.6 (*)    MCH 18.4 (*)    MCHC 29.4 (*)    RDW 18.3 (*)    All other components within normal limits   ____________________________________________  EKG   ____________________________________________  RADIOLOGY  No results found.  ____________________________________________    PROCEDURES  Procedure(s) performed:     Procedures     Medications - No data to display   ____________________________________________   INITIAL IMPRESSION / ASSESSMENT AND PLAN / ED COURSE  Pertinent labs & imaging results that were available during my care of the patient were reviewed by me and considered in my medical decision making (see chart for details).      Assessment and plan Vaginal bleeding in the first trimester Failed pregnancy 30 year old female presents to the emergency department with vaginal bleeding in the first trimester.  She is here for repeat beta-hCG given concern for failed pregnancy.  Vital signs were reassuring at triage.  On physical exam, patient was alert, active and nontoxic-appearing.  Abdomen was soft and nontender on exam  Patient's beta-hCG was downtrending in the emergency department.  Her hemoglobin had increased from 7.3.  Patient education regarding likely miscarriage was given using a Engineer, structural and expectant management was recommended at home.  Advised patient to follow-up with her OB/GYN as needed.  All patient questions were answered.    ____________________________________________  FINAL CLINICAL IMPRESSION(S) / ED DIAGNOSES  Final diagnoses:  Vaginal bleeding in pregnancy      NEW MEDICATIONS STARTED DURING THIS VISIT:  ED Discharge Orders     None          This chart was dictated using voice recognition software/Dragon. Despite best efforts to proofread, errors can occur which can change the meaning. Any change was purely unintentional.     Orvil Feil, PA-C 06/15/20 1816    Minna Antis, MD 06/15/20 [phone]

## 2021-11-20 ENCOUNTER — Ambulatory Visit (LOCAL_COMMUNITY_HEALTH_CENTER): Payer: Medicaid Other

## 2021-11-20 VITALS — BP 110/76 | Ht 64.0 in | Wt 106.0 lb

## 2021-11-20 DIAGNOSIS — Z3201 Encounter for pregnancy test, result positive: Secondary | ICD-10-CM | POA: Diagnosis not present

## 2021-11-20 LAB — PREGNANCY, URINE: Preg Test, Ur: POSITIVE — AB

## 2021-11-20 MED ORDER — PRENATAL 27-0.8 MG PO TABS: 1.0000 | ORAL_TABLET | Freq: Every day | ORAL | 0 refills | Status: AC

## 2021-11-20 NOTE — Progress Notes (Signed)
UPT positive. Plans prenatal care at [facility]. Sent to clerk for preadmit. Lang line [phone].  The patient was dispensed prenatal vitamins #100 today. I provided counseling today regarding the medication. We discussed the medication, the side effects and when to call clinic. Patient given the opportunity to ask questions. Questions answered.     Josie Saunders, RN

## 2021-12-31 ENCOUNTER — Other Ambulatory Visit: Payer: Medicaid Other

## 2021-12-31 ENCOUNTER — Other Ambulatory Visit: Payer: Self-pay

## 2021-12-31 ENCOUNTER — Emergency Department: Payer: Medicaid Other

## 2021-12-31 ENCOUNTER — Emergency Department
Admission: EM | Admit: 2021-12-31 | Discharge: 2021-12-31 | Disposition: A | Discharge status: Home / Self Care | Payer: Medicaid Other | Attending: Student in an Organized Health Care Education/Training Program | Admitting: Student in an Organized Health Care Education/Training Program

## 2021-12-31 DIAGNOSIS — R1030 Lower abdominal pain, unspecified: Secondary | ICD-10-CM | POA: Insufficient documentation

## 2021-12-31 DIAGNOSIS — R109 Unspecified abdominal pain: Secondary | ICD-10-CM

## 2021-12-31 DIAGNOSIS — R102 Pelvic and perineal pain: Secondary | ICD-10-CM | POA: Insufficient documentation

## 2021-12-31 DIAGNOSIS — O26891 Other specified pregnancy related conditions, first trimester: Secondary | ICD-10-CM

## 2021-12-31 DIAGNOSIS — Z3A12 12 weeks gestation of pregnancy: Secondary | ICD-10-CM | POA: Diagnosis not present

## 2021-12-31 LAB — CBC
HCT: 35.2 % — ABNORMAL LOW (ref 36.0–46.0)
Hemoglobin: 10.7 g/dL — ABNORMAL LOW (ref 12.0–15.0)
MCH: 22.1 pg — ABNORMAL LOW (ref 26.0–34.0)
MCHC: 30.4 g/dL (ref 30.0–36.0)
MCV: 72.7 fL — ABNORMAL LOW (ref 80.0–100.0)
Platelets: 254 10*3/uL (ref 150–400)
RBC: 4.84 MIL/uL (ref 3.87–5.11)
RDW: 23.6 % — ABNORMAL HIGH (ref 11.5–15.5)
WBC: 12.3 10*3/uL — ABNORMAL HIGH (ref 4.0–10.5)
nRBC: 0 % (ref 0.0–0.2)

## 2021-12-31 LAB — URINALYSIS, ROUTINE W REFLEX MICROSCOPIC
Bilirubin Urine: NEGATIVE
Glucose, UA: NEGATIVE mg/dL
Hgb urine dipstick: NEGATIVE
Ketones, ur: NEGATIVE mg/dL
Leukocytes,Ua: NEGATIVE
Nitrite: NEGATIVE
Protein, ur: NEGATIVE mg/dL
Specific Gravity, Urine: 1.021 (ref 1.005–1.030)
pH: 5 (ref 5.0–8.0)

## 2021-12-31 LAB — COMPREHENSIVE METABOLIC PANEL
ALT: 16 U/L (ref 0–44)
AST: 21 U/L (ref 15–41)
Albumin: 4 g/dL (ref 3.5–5.0)
Alkaline Phosphatase: 57 U/L (ref 38–126)
Anion gap: 4 — ABNORMAL LOW (ref 5–15)
BUN: 16 mg/dL (ref 6–20)
CO2: 24 mmol/L (ref 22–32)
Calcium: 9 mg/dL (ref 8.9–10.3)
Chloride: 106 mmol/L (ref 98–111)
Creatinine, Ser: 0.61 mg/dL (ref 0.44–1.00)
GFR, Estimated: >60 mL/min (ref >60)
Glucose, Bld: 99 mg/dL (ref 70–99)
Potassium: 4 mmol/L (ref 3.5–5.1)
Sodium: 134 mmol/L — ABNORMAL LOW (ref 135–145)
Total Bilirubin: 0.5 mg/dL (ref 0.3–1.2)
Total Protein: 7.5 g/dL (ref 6.5–8.1)

## 2021-12-31 LAB — POC URINE PREG, ED: Preg Test, Ur: POSITIVE — AB

## 2021-12-31 LAB — HCG, QUANTITATIVE, PREGNANCY: hCG, Beta Chain, Quant, S: 144319 m[IU]/mL — ABNORMAL HIGH (ref <5)

## 2021-12-31 LAB — LIPASE, BLOOD: Lipase: 76 U/L — ABNORMAL HIGH (ref 11–51)

## 2021-12-31 MED ORDER — ACETAMINOPHEN 500 MG PO TABS
1000.0000 mg | ORAL_TABLET | Freq: Once | ORAL | Status: AC
  Administered 2021-12-31: 1000 mg via ORAL
  Filled 2021-12-31: qty 2

## 2021-12-31 NOTE — ED Notes (Signed)
Pt having bilateral lower abd pain that is worse on the left side. Pt states her last bowel movement was today at 1pm. She denies diarrhea and vomiting. She feels nauseous. Pt states she has not had any abd surgeries in the past. Denies vaginal discharge or urinary symptoms.

## 2021-12-31 NOTE — ED Triage Notes (Signed)
Pt sts that she has been having abd pain. Pt is [redacted] weeks pregnant. G2P1A2. Pt denies any vaginal bleeding.

## 2021-12-31 NOTE — ED Notes (Signed)
Pt is [redacted] weeks pregnant

## 2021-12-31 NOTE — ED Provider Notes (Signed)
[facility] Provider Note    Event Date/Time   First MD Initiated Contact with Patient 12/31/21 1835     (approximate)   History   Abdominal Pain   HPI  Leah Ayers is a 31 y.o. female with no significant past medical history presents to the emergency department for treatment and evaluation of pelvic pain. She estimates she is [redacted] weeks pregnant. Pain has been intermittent on the left side until today. Pain is now constant on the left side and across lower abdomen. No vaginal bleeding or discharge. No dysuria. No fever.       Physical Exam   Triage Vital Signs: ED Triage Vitals  Enc Vitals Group     BP 12/31/21 1814 116/65     Pulse Rate 12/31/21 1814 82     Resp 12/31/21 1814 17     Temp 12/31/21 1814 98.3 F (36.8 C)     Temp Source 12/31/21 1814 Oral     SpO2 12/31/21 1814 100 %     Weight 12/31/21 1815 115 lb (52.2 kg)     Height 12/31/21 1854 5\' 2"  (1.575 m)     Head Circumference --      Peak Flow --      Pain Score 12/31/21 1815 7     Pain Loc --      Pain Edu? --      Excl. in GC? --     Most recent vital signs: Vitals:   12/31/21 1814 12/31/21 2124  BP: 116/65 120/84  Pulse: 82 77  Resp: 17 19  Temp: 98.3 F (36.8 C) 98.7 F (37.1 C)  SpO2: 100% 98%     General: Awake, no distress.  CV:  Good peripheral perfusion.  Resp:  Normal effort.  Abd:  No distention. Soft. Diffuse tenderness over left pelvic area and suprapubic region. Other:     ED Results / Procedures / Treatments   Labs (all labs ordered are listed, but only abnormal results are displayed) Labs Reviewed  LIPASE, BLOOD - Abnormal; Notable for the following components:      Result Value   Lipase 76 (*)    All other components within normal limits  COMPREHENSIVE METABOLIC PANEL - Abnormal; Notable for the following components:   Sodium 134 (*)    Anion gap 4 (*)    All other components within normal limits  CBC - Abnormal; Notable for the  following components:   WBC 12.3 (*)    Hemoglobin 10.7 (*)    HCT 35.2 (*)    MCV 72.7 (*)    MCH 22.1 (*)    RDW 23.6 (*)    All other components within normal limits  URINALYSIS, ROUTINE W REFLEX MICROSCOPIC - Abnormal; Notable for the following components:   Color, Urine YELLOW (*)    APPearance CLEAR (*)    All other components within normal limits  HCG, QUANTITATIVE, PREGNANCY - Abnormal; Notable for the following components:   hCG, Beta Chain, Quant, S 144,319 (*)    All other components within normal limits  POC URINE PREG, ED - Abnormal; Notable for the following components:   Preg Test, Ur POSITIVE (*)    All other components within normal limits     EKG  Not indicated.   RADIOLOGY  OB ultrasound with Doppler shows single live IUP 11 weeks 6 days with fetal heart rate of 169.  Normal arterial and venous waveforms supplying both ovaries.  PROCEDURES:  Critical  Care performed: No  Procedures   MEDICATIONS ORDERED IN ED: Medications  acetaminophen (TYLENOL) tablet 1,000 mg (1,000 mg Oral Given 12/31/21 2110)     IMPRESSION / MDM / ASSESSMENT AND PLAN / ED COURSE  I reviewed the triage vital signs and the nursing notes.                              Differential diagnosis includes, but is not limited to abdominal pain in pregnancy, ectopic pregnancy, ovarian torsion.  Patient's presentation is most consistent with acute presentation with potential threat to life or bodily function.  31 year old female presenting to the emergency department for treatment and evaluation of abdominal pain during pregnancy.  See HPI for further details.  No focal tenderness on exam.  Plan will be to get an ultrasound with Doppler.  Patient aware and agreeable to the plan.  Ultrasound is negative for acute findings.  Single IUP with a heart rate of 169.  No indication of torsion or ectopic is noted.  Results discussed with the patient who feels reassured.  She will take Tylenol  and use heating pad.  She has an appointment scheduled with the health department for December 19 for her intake appointment.  She was encouraged to keep that appointment as scheduled.  She was also encouraged to call to see if they had anything sooner if the pain continues.  ER return precautions also discussed.        FINAL CLINICAL IMPRESSION(S) / ED DIAGNOSES   Final diagnoses:  Abdominal pain in pregnancy, first trimester     Rx / DC Orders   ED Discharge Orders     None        Note:  This document was prepared using Dragon voice recognition software and may include unintentional dictation errors.   Chinita Pester, FNP 12/31/21 2125    Willy Eddy, MD 01/01/22 [id number]

## 2022-01-04 NOTE — Progress Notes (Deleted)
Patient called stating she started having a fever today this morning (chills and feeling warm to touch). She Tylenol 500mg  x1 at 3pm and so far feels better. She states she had started having body aches, headache and dry cough.   Informed patient that to be seen in clinic she needs to be afrible 24 hours without Tylenol.  Patient verbalized understanding that we will need to re-schedule this appointment.   Informed patient she can take Tylenol 500mg  every 4 hours as needed. Informed her to hydrate and take increase nutrients such as protein and monitor temp.   Patient informed to take a at-home covid test, she states she has one at home she can do. Gave patient [facility] phone number to call if she has any questions.   Also informed patient to go to the ER if she is having difficulty breathing, abdominal pain, or vaginal bleeding.   , RN

## 2022-01-05 ENCOUNTER — Telehealth: Payer: Self-pay

## 2022-01-05 NOTE — Telephone Encounter (Signed)
Patient called stating she started having a fever today this morning (chills and feeling warm to touch). She Tylenol 500mg  x1 at 3pm and so far feels better. She states she had started having body aches, headache and dry cough.   Informed patient that to be seen in clinic she needs to be afrible 24 hours without Tylenol.  Patient verbalized understanding that we will need to re-schedule this new OB appointment.   Informed patient she can take Tylenol 500mg  every 4 hours as needed. Informed her to hydrate and take increase nutrients such as protein and monitor temp.   Patient informed to take a at-home covid test, she states she has one at home she can do. Gave patient [facility] phone number to call if she has any questions.   Also informed patient to go to the ER if she is having difficulty breathing, abdominal pain, or vaginal bleeding.   New OB appt rescheduled for 01/19/22 for 1pm arrival time. Patient verbalized understanding.   , RN

## 2022-01-18 NOTE — L&D Delivery Note (Signed)
Delivery Note  Leah Ayers is a [id number] at [redacted]w[redacted]d, Patient's last menstrual period was 10/13/2021 (exact date)., not consistent with [place] at 109w6d. Estimated Date of Delivery: 07/16/22   First Stage: Labor onset: 0430 Augmentation: oxytocin Analgesia Eliezer Lofts intrapartum: IVPM SROM at 0430 GBS: negative  Second Stage: Complete dilation at 1527 Onset of pushing at 1527 FHR second stage 125 bpm with moderate, early and variable decels with pushing   Leah Ayers presented to L&D with SROM. She was initially expectantly managed.  Low dose oxytocin was initiated for augmentation after several hours of slow cervical change. She progressed to C/C/+2 with a strong urge to push.  She pushed quickly over approximately 4 minutes for a spontaneous vaginal birth.  Delivery of a viable baby boy on 07/03/2022 at 1531 by CNM Delivery of fetal head in OA position with restitution to ROT.Loose nuchal cord, delivered through;  Anterior then posterior shoulders delivered easily with gentle downward traction. Baby placed on mom's chest, and attended to by baby RN Cord double clamped after cessation of pulsation, cut by father of baby  Cord blood sample collection: Yes O POS  Third Stage: Oxytocin bolus started after delivery of infant for hemorrhage prophylaxis  Placenta delivered via Tomasa Blase mechanism intact with 3 VC @ 1536 Placenta disposition: discarded  Uterine tone firm / bleeding small  Left periurethral laceration identified, hemostatic, approximates well Anesthesia for repair: N/A Repair not indicated  Est. Blood Loss (mL): 250 ml  Complications: None  Mom to postpartum.  Baby to Couplet care / Skin to Skin.  Newborn: Information for the patient's newborn:  Leah, Ayers [[id number]]  Live born female  Birth Weight: 5 lb 13.1 oz (2640 g) APGAR: 9, 9  Newborn Delivery   Birth date/time: 07/03/2022 15:31:00 Delivery type: Vaginal, Spontaneous       Feeding planned: breast and  formula feeding  ---------- Margaretmary Eddy, CNM Certified Nurse Midwife [facility]

## 2022-01-19 ENCOUNTER — Encounter: Payer: Self-pay | Admitting: Advanced Practice Midwife

## 2022-01-19 ENCOUNTER — Ambulatory Visit: Payer: Medicaid Other | Admitting: Advanced Practice Midwife

## 2022-01-19 VITALS — BP 94/63 | HR 88 | Temp 97.1°F | Wt 103.4 lb

## 2022-01-19 DIAGNOSIS — O0932 Supervision of pregnancy with insufficient antenatal care, second trimester: Secondary | ICD-10-CM

## 2022-01-19 DIAGNOSIS — O0930 Supervision of pregnancy with insufficient antenatal care, unspecified trimester: Secondary | ICD-10-CM | POA: Insufficient documentation

## 2022-01-19 DIAGNOSIS — O99012 Anemia complicating pregnancy, second trimester: Secondary | ICD-10-CM

## 2022-01-19 DIAGNOSIS — Z3482 Encounter for supervision of other normal pregnancy, second trimester: Secondary | ICD-10-CM | POA: Diagnosis not present

## 2022-01-19 DIAGNOSIS — Z23 Encounter for immunization: Secondary | ICD-10-CM

## 2022-01-19 DIAGNOSIS — Z2839 Other underimmunization status: Secondary | ICD-10-CM

## 2022-01-19 DIAGNOSIS — O99019 Anemia complicating pregnancy, unspecified trimester: Secondary | ICD-10-CM | POA: Insufficient documentation

## 2022-01-19 DIAGNOSIS — O09899 Supervision of other high risk pregnancies, unspecified trimester: Secondary | ICD-10-CM

## 2022-01-19 LAB — URINALYSIS
Bilirubin, UA: NEGATIVE
Glucose, UA: NEGATIVE
Leukocytes,UA: NEGATIVE
Nitrite, UA: NEGATIVE
Protein,UA: NEGATIVE
Specific Gravity, UA: 1.03 (ref 1.005–1.030)
Urobilinogen, Ur: 0.2 mg/dL (ref 0.2–1.0)
pH, UA: 5.5 (ref 5.0–7.5)

## 2022-01-19 LAB — WET PREP FOR TRICH, YEAST, CLUE
Trichomonas Exam: NEGATIVE
Yeast Exam: NEGATIVE

## 2022-01-19 LAB — HEMOGLOBIN, FINGERSTICK: Hemoglobin: 10.8 g/dL — ABNORMAL LOW (ref 11.1–15.9)

## 2022-01-19 LAB — OB RESULTS CONSOLE VARICELLA ZOSTER ANTIBODY, IGG: Varicella: IMMUNE

## 2022-01-19 LAB — OB RESULTS CONSOLE GC/CHLAMYDIA: Neisseria Gonorrhea: NEGATIVE

## 2022-01-19 MED ORDER — IRON (FERROUS SULFATE) 325 (65 FE) MG PO TABS: 325.0000 mg | ORAL_TABLET | Freq: Every day | ORAL | 0 refills | Status: AC

## 2022-01-19 NOTE — Addendum Note (Signed)
Addended by: Harland Dingwall on: 01/19/2022 05:54 PM   Modules accepted: Orders

## 2022-01-19 NOTE — Progress Notes (Addendum)
[facility]   INITIAL PRENATAL VISIT NOTE  Subjective:  Leah Ayers is a 33 y.o. MHF exsmoker [id number](71 yo son) at [redacted]w[redacted]d being seen today to start prenatal care at the [facility] Department. She feels "happy" about surprise pregnancy with no birth control x 13 years. 32 yo employed husband of 5 years feels "really happy" about pregnancy. She quit working 01/01/22 and is living with her husband and son. In [place] x 10 years from [place]. Went to ER x1 this pregnancy 12/31/21 with u/s at 11 6/7 with EDC=07/16/22. LMP 10/13/21. Pt states N&V has resolved. Last cig and cigar 2016. Last ETOH 10/13/21 (8 beers). Last dental exam 4 years ago.last pap 02/07/2018 neg.  She is currently monitored for the following issues for this low-risk pregnancy and has Supervision of normal intrauterine pregnancy in multigravida, second trimester and Late prenatal care 14 4/7 wks on their problem list.  Patient reports no complaints.  Contractions: Not present. Vag. Bleeding: None.  Movement: Absent. Denies leaking of fluid.   Indications for ASA therapy (per uptodate) One of the following: Previous pregnancy with preeclampsia, especially early onset and with an adverse outcome No Multifetal gestation No Chronic hypertension No Type 1 or 2 diabetes mellitus No Chronic kidney disease No Autoimmune disease (antiphospholipid syndrome, systemic lupus erythematosus) No  Two or more of the following: Nulliparity No Obesity (body mass index >30 kg/m2) No Family history of preeclampsia in mother or sister No Age ?35 years No Sociodemographic characteristics (African American race, low socioeconomic level) No Personal risk factors (eg, previous pregnancy with low birth weight or small for gestational age infant, previous adverse pregnancy outcome [eg, stillbirth], interval >10 years between pregnancies) Yes   The following portions of the patient's history were  reviewed and updated as appropriate: allergies, current medications, past family history, past medical history, past social history, past surgical history and problem list. Problem list updated.  Objective:   Vitals:   01/19/22 1342  BP: 94/63  Pulse: 88  Temp: (!) 97.1 F (36.2 C)  Weight: 103 lb 6.4 oz (46.9 kg)    Fetal Status: Fetal Heart Rate (bpm): 160 Fundal Height: 16 cm Movement: Absent  Presentation: Undeterminable   Physical Exam Vitals and nursing note reviewed.  Constitutional:      General: She is not in acute distress.    Appearance: Normal appearance. She is well-developed and normal weight.  HENT:     Head: Normocephalic and atraumatic.     Right Ear: External ear normal.     Left Ear: External ear normal.     Nose: Nose normal. No congestion or rhinorrhea.     Mouth/Throat:     Lips: Pink.     Mouth: Mucous membranes are moist.     Dentition: Normal dentition. No dental caries.     Pharynx: Oropharynx is clear. Uvula midline.     Comments: Dentition: fair; last dental exam 4 years ago Eyes:     General: No scleral icterus.    Conjunctiva/sclera: Conjunctivae normal.  Neck:     Thyroid: No thyroid mass, thyromegaly or thyroid tenderness.  Cardiovascular:     Rate and Rhythm: Normal rate.     Pulses: Normal pulses.     Comments: Extremities are warm and well perfused Pulmonary:     Effort: Pulmonary effort is normal.     Breath sounds: Normal breath sounds.  Chest:     Chest wall: No mass.  Breasts:  Tanner Score is 5.     Breasts are symmetrical.     Right: Normal. No mass, nipple discharge or skin change.     Left: Normal. No mass, nipple discharge or skin change.  Abdominal:     Palpations: Abdomen is soft.     Tenderness: There is no abdominal tenderness.     Comments: Gravid, soft without masses or tenderness, uterus 14-16 wks size, FHR=160  Genitourinary:    General: Normal vulva.     Exam position: Lithotomy position.     Pubic Area:  No rash.      Labia:        Right: No rash.        Left: No rash.      Vagina: Vaginal discharge (white creamy leukorrhea, ph<4.5) present.     Cervix: Friability (friable to pap) present.     Uterus: Normal. Enlarged (Gravid 14-16 wks size). Not tender.      Rectum: Normal. No external hemorrhoid.     Comments: Pap done Musculoskeletal:     Right lower leg: No edema.     Left lower leg: No edema.  Lymphadenopathy:     Cervical: No cervical adenopathy.     Upper Body:     Right upper body: No axillary adenopathy.     Left upper body: No axillary adenopathy.  Skin:    General: Skin is warm.     Capillary Refill: Capillary refill takes less than 2 seconds.  Neurological:     Mental Status: She is alert.     Assessment and Plan:  Pregnancy: G4P1021 at [redacted]w[redacted]d  1. Prenatal care, subsequent pregnancy, second trimester Counseled on weight gain of 25-35 lbs this pregnancy Anatomy u/s ordered  - IGP, Aptima HPV - Urinalysis (Urine Dip) - Hemoglobin, venipuncture - WET PREP FOR TRICH, YEAST, CLUE - [id number] Drug Screen - Prenatal Profile I - QuantiFERON-TB Gold Plus - Hemoglobinopathy evaluation -532992 - Varicella zoster antibody, IgG  2. Late prenatal care 14 wks     Discussed overview of care and coordination with inpatient delivery practices including [facility], [facility], [facility] and [facility].   Reviewed Centering pregnancy as standard of care at [facility]   Preterm labor symptoms and general obstetric precautions including but not limited to vaginal bleeding, contractions, leaking of fluid and fetal movement were reviewed in detail with the patient.  Please refer to After Visit Summary for other counseling recommendations.   Return in about 4 weeks (around 02/16/2022) for routine PNC.  No future appointments.  Herbie Saxon, CNM

## 2022-01-19 NOTE — Addendum Note (Signed)
Addended by: Jenetta Downer on: 01/19/2022 04:32 PM   Modules accepted: Orders

## 2022-01-19 NOTE — Progress Notes (Addendum)
Initial prenatal visit on 01/19/22 at 14w 4d. Hgb = 10.8, iron supplement initiated per standing order.   Faxed U/S referral on 01/20/22, confirmation received.  Harland Dingwall, RN

## 2022-01-20 LAB — LEAD, BLOOD (ADULT >= 16 YRS): Lead-Whole Blood: <1 ug/dL (ref 0.0–3.4)

## 2022-01-21 DIAGNOSIS — O09899 Supervision of other high risk pregnancies, unspecified trimester: Secondary | ICD-10-CM | POA: Insufficient documentation

## 2022-01-21 DIAGNOSIS — Z2839 Other underimmunization status: Secondary | ICD-10-CM

## 2022-01-21 HISTORY — DX: Other underimmunization status: Z28.39

## 2022-01-21 HISTORY — DX: Supervision of other high risk pregnancies, unspecified trimester: O09.899

## 2022-01-21 LAB — 789231 7+OXYCODONE-BUND
Amphetamines, Urine: NEGATIVE ng/mL
BENZODIAZ UR QL: NEGATIVE ng/mL
Barbiturate screen, urine: NEGATIVE ng/mL
Cannabinoid Quant, Ur: NEGATIVE ng/mL
Cocaine (Metab.): NEGATIVE ng/mL
OPIATE SCREEN URINE: NEGATIVE ng/mL
Oxycodone/Oxymorphone, Urine: NEGATIVE ng/mL
PCP Quant, Ur: NEGATIVE ng/mL

## 2022-01-21 LAB — PREGNANCY, INITIAL SCREEN
Antibody Screen: NEGATIVE
Basophils Absolute: 0 10*3/uL (ref 0.0–0.2)
Basos: 0 %
Bilirubin, UA: NEGATIVE
Chlamydia trachomatis, NAA: NEGATIVE
EOS (ABSOLUTE): 0.1 10*3/uL (ref 0.0–0.4)
Eos: 1 %
Glucose, UA: NEGATIVE
HCV Ab: NONREACTIVE
HIV Screen 4th Generation wRfx: NONREACTIVE
Hematocrit: 33.6 % — ABNORMAL LOW (ref 34.0–46.6)
Hemoglobin: 11.1 g/dL (ref 11.1–15.9)
Hepatitis B Surface Ag: NEGATIVE
Immature Grans (Abs): 0 10*3/uL (ref 0.0–0.1)
Immature Granulocytes: 0 %
Leukocytes,UA: NEGATIVE
Lymphocytes Absolute: 1.8 10*3/uL (ref 0.7–3.1)
Lymphs: 17 %
MCH: 24.1 pg — ABNORMAL LOW (ref 26.6–33.0)
MCHC: 33 g/dL (ref 31.5–35.7)
MCV: 73 fL — ABNORMAL LOW (ref 79–97)
Monocytes Absolute: 0.5 10*3/uL (ref 0.1–0.9)
Monocytes: 5 %
Neisseria Gonorrhoeae by PCR: NEGATIVE
Neutrophils Absolute: 8.5 10*3/uL — ABNORMAL HIGH (ref 1.4–7.0)
Neutrophils: 77 %
Nitrite, UA: NEGATIVE
Platelets: 275 10*3/uL (ref 150–450)
Protein,UA: NEGATIVE
RBC: 4.61 x10E6/uL (ref 3.77–5.28)
RDW: 22.8 % — ABNORMAL HIGH (ref 11.7–15.4)
RPR Ser Ql: NONREACTIVE
Rh Factor: POSITIVE
Rubella Antibodies, IGG: <0.9 index — ABNORMAL LOW (ref >0.99)
Specific Gravity, UA: 1.026 (ref 1.005–1.030)
Urobilinogen, Ur: 0.2 mg/dL (ref 0.2–1.0)
WBC: 11 10*3/uL — ABNORMAL HIGH (ref 3.4–10.8)
pH, UA: 6 (ref 5.0–7.5)

## 2022-01-21 LAB — MICROSCOPIC EXAMINATION
Bacteria, UA: NONE SEEN
Casts: NONE SEEN /lpf
WBC, UA: NONE SEEN /hpf (ref 0–5)

## 2022-01-21 LAB — IGP, APTIMA HPV
HPV Aptima: NEGATIVE
PAP Smear Comment: 0

## 2022-01-21 LAB — QUANTIFERON-TB GOLD PLUS
QuantiFERON Mitogen Value: >10 IU/mL
QuantiFERON Nil Value: 0.13 IU/mL
QuantiFERON TB1 Ag Value: 0.04 IU/mL
QuantiFERON TB2 Ag Value: 0.03 IU/mL
QuantiFERON-TB Gold Plus: NEGATIVE

## 2022-01-21 LAB — URINE CULTURE, OB REFLEX: Organism ID, Bacteria: NO GROWTH

## 2022-01-21 LAB — VARICELLA ZOSTER ANTIBODY, IGG: Varicella zoster IgG: 823 index (ref >165)

## 2022-01-21 LAB — HCV INTERPRETATION

## 2022-01-22 LAB — FE+CBC/D/PLT+TIBC+FER+RETIC
Basophils Absolute: 0.1 10*3/uL (ref 0.0–0.2)
Basos: 1 %
EOS (ABSOLUTE): 0.1 10*3/uL (ref 0.0–0.4)
Eos: 1 %
Ferritin: 35 ng/mL (ref 15–150)
Hematocrit: 35.4 % (ref 34.0–46.6)
Hemoglobin: 11.4 g/dL (ref 11.1–15.9)
Immature Grans (Abs): 0.1 10*3/uL (ref 0.0–0.1)
Immature Granulocytes: 1 %
Iron Saturation: 19 % (ref 15–55)
Iron: 82 ug/dL (ref 27–159)
Lymphocytes Absolute: 1.7 10*3/uL (ref 0.7–3.1)
Lymphs: 16 %
MCH: 24 pg — ABNORMAL LOW (ref 26.6–33.0)
MCHC: 32.2 g/dL (ref 31.5–35.7)
MCV: 75 fL — ABNORMAL LOW (ref 79–97)
Monocytes Absolute: 0.6 10*3/uL (ref 0.1–0.9)
Monocytes: 5 %
Neutrophils Absolute: 8.4 10*3/uL — ABNORMAL HIGH (ref 1.4–7.0)
Neutrophils: 76 %
Platelets: 258 10*3/uL (ref 150–450)
RBC: 4.75 x10E6/uL (ref 3.77–5.28)
RDW: 23.4 % — ABNORMAL HIGH (ref 11.7–15.4)
Retic Ct Pct: 1.5 % (ref 0.6–2.6)
Total Iron Binding Capacity: 443 ug/dL (ref 250–450)
UIBC: 361 ug/dL (ref 131–425)
WBC: 10.9 10*3/uL — ABNORMAL HIGH (ref 3.4–10.8)

## 2022-01-22 LAB — HGB FRACTIONATION CASCADE
Hgb A2: 2.5 % (ref 1.8–3.2)
Hgb A: 97.5 % (ref 96.4–98.8)
Hgb F: 0 % (ref 0.0–2.0)
Hgb S: 0 %

## 2022-01-25 ENCOUNTER — Telehealth: Payer: Self-pay

## 2022-01-25 NOTE — Telephone Encounter (Signed)
Call to client with [facility] [employer] ID # [phone] to verify aware of [facility] [place] appt on 02/17/2022 at 1515. Left message to call [facility] regarding above appt and number to call provided. Rich Number, RN

## 2022-01-26 NOTE — Telephone Encounter (Signed)
Call to client with [facility] [employer] ID # [phone] and she verbalizes correct date/time of [facility] [place] appt. Rich Number, RN

## 2022-02-16 ENCOUNTER — Ambulatory Visit: Payer: Medicaid Other | Admitting: Advanced Practice Midwife

## 2022-02-16 VITALS — BP 95/60 | HR 84 | Temp 98.2°F | Wt 107.8 lb

## 2022-02-16 DIAGNOSIS — O99019 Anemia complicating pregnancy, unspecified trimester: Secondary | ICD-10-CM

## 2022-02-16 DIAGNOSIS — O0930 Supervision of pregnancy with insufficient antenatal care, unspecified trimester: Secondary | ICD-10-CM

## 2022-02-16 DIAGNOSIS — O0932 Supervision of pregnancy with insufficient antenatal care, second trimester: Secondary | ICD-10-CM

## 2022-02-16 DIAGNOSIS — Z3482 Encounter for supervision of other normal pregnancy, second trimester: Secondary | ICD-10-CM

## 2022-02-16 DIAGNOSIS — O99012 Anemia complicating pregnancy, second trimester: Secondary | ICD-10-CM

## 2022-02-16 LAB — HEMOGLOBIN, FINGERSTICK: Hemoglobin: 11.1 g/dL (ref 11.1–15.9)

## 2022-02-16 NOTE — Progress Notes (Signed)
Correctly verbalizes how to take iron tablet and prenatal vitamin. Taking iron tablet with juice. Hgb today. Aware of [facility] [place] appt 02/17/2022 at 1515. Verified has Birth Financial planner. Declined MaterniT 21 and AFP only today. Rich Number, RN Hgb = 11.1 and to continue iron and prenatal vitamin as currently taking. Rich Number, RN

## 2022-02-16 NOTE — Progress Notes (Signed)
[place] [facility]  PRENATAL VISIT NOTE  Subjective:  Leah Ayers is a 32 y.o. [phone] at [redacted]w[redacted]d being seen today for ongoing prenatal care.  She is currently monitored for the following issues for this low-risk pregnancy and has Supervision of normal intrauterine pregnancy in multigravida, second trimester; Late prenatal care 14 4/7 wks; Anemia affecting pregnancy; and Rubella non-immune status, antepartum on their problem list.  Patient reports no complaints.  Contractions: Not present. Vag. Bleeding: None.  Movement: Present. Denies leaking of fluid/ROM.   The following portions of the patient's history were reviewed and updated as appropriate: allergies, current medications, past family history, past medical history, past social history, past surgical history and problem list. Problem list updated.  Objective:   Vitals:   02/16/22 1322  BP: 95/60  Pulse: 84  Temp: 98.2 F (36.8 C)  Weight: 107 lb 12.8 oz (48.9 kg)    Fetal Status: Fetal Heart Rate (bpm): 140 Fundal Height: 20 cm Movement: Present     General:  Alert, oriented and cooperative. Patient is in no acute distress.  Skin: Skin is warm and dry. No rash noted.   Cardiovascular: Normal heart rate noted  Respiratory: Normal respiratory effort, no problems with respiration noted  Abdomen: Soft, gravid, appropriate for gestational age.  Pain/Pressure: Absent     Pelvic: Cervical exam deferred        Extremities: Normal range of motion.  Edema: None  Mental Status: Normal mood and affect. Normal behavior. Normal judgment and thought content.   Assessment and Plan:  Pregnancy: G4P1021 at [redacted]w[redacted]d  1. Anemia during pregnancy in second trimester Taking FeSo4 I daily with juice - Hemoglobin, venipuncture  2. Supervision of normal intrauterine pregnancy in multigravida, second trimester Not working Not exercising Hasn't made dental apt yet; last went 4 years ago Declined NIPS and  AFP only Reviewed 12/31/21 u/s at 11 6/7 Has anatomy u/s 02/17/22  3. Late prenatal care 14 4/7 wks    Preterm labor symptoms and general obstetric precautions including but not limited to vaginal bleeding, contractions, leaking of fluid and fetal movement were reviewed in detail with the patient. Please refer to After Visit Summary for other counseling recommendations.  Return in about 4 weeks (around 03/16/2022) for routine PNC.  No future appointments.  Herbie Saxon, CNM

## 2022-02-17 NOTE — Addendum Note (Signed)
Addended by: Cletis Media on: 02/17/2022 03:13 PM   Modules accepted: Orders

## 2022-02-25 ENCOUNTER — Encounter: Payer: Self-pay | Admitting: Family Medicine

## 2022-02-25 DIAGNOSIS — O4402 Placenta previa specified as without hemorrhage, second trimester: Secondary | ICD-10-CM | POA: Insufficient documentation

## 2022-02-26 NOTE — Progress Notes (Signed)
[place] U/S referral faxed with confirmation.Jenetta Downer, RN

## 2022-03-16 ENCOUNTER — Ambulatory Visit: Payer: Medicaid Other | Admitting: Family

## 2022-03-16 VITALS — BP 106/63 | HR 81 | Temp 97.8°F | Wt 113.6 lb

## 2022-03-16 DIAGNOSIS — Z3482 Encounter for supervision of other normal pregnancy, second trimester: Secondary | ICD-10-CM

## 2022-03-16 DIAGNOSIS — O99012 Anemia complicating pregnancy, second trimester: Secondary | ICD-10-CM

## 2022-03-16 DIAGNOSIS — O4402 Placenta previa specified as without hemorrhage, second trimester: Secondary | ICD-10-CM

## 2022-03-16 NOTE — Progress Notes (Signed)
Aware of 03/19/2022 [facility] [place] appt. Rich Number, RN

## 2022-03-21 NOTE — Progress Notes (Signed)
[place] [facility]  PRENATAL VISIT NOTE  Subjective:  Leah Ayers is a 32 y.o. [phone] at 41w2dbeing seen today for ongoing prenatal care.  She is currently monitored for the following issues for this low-risk pregnancy and has Supervision of normal intrauterine pregnancy in multigravida, second trimester; Late prenatal care 14 4/7 wks; Anemia affecting pregnancy; Rubella non-immune status, antepartum; and Placenta previa antepartum in second trimester on their problem list.  Patient reports no complaints.  Contractions: Not present. Vag. Bleeding: None.  Movement: Present. Denies leaking of fluid/ROM.   The following portions of the patient's history were reviewed and updated as appropriate: allergies, current medications, past family history, past medical history, past social history, past surgical history and problem list. Problem list updated.  Objective:   Vitals:   03/16/22 1331  BP: 106/63  Pulse: 81  Temp: 97.8 F (36.6 C)  Weight: 113 lb 9.6 oz (51.5 kg)    Fetal Status: Fetal Heart Rate (bpm): 152 Fundal Height: 22 cm Movement: Present     General:  Alert, oriented and cooperative. Patient is in no acute distress.  Skin: Skin is warm and dry. No rash noted.   Cardiovascular: Normal heart rate noted  Respiratory: Normal respiratory effort, no problems with respiration noted  Abdomen: Soft, gravid, appropriate for gestational age.  Pain/Pressure: Absent     Pelvic: Cervical exam deferred        Extremities: Normal range of motion.  Edema: None  Mental Status: Normal mood and affect. Normal behavior. Normal judgment and thought content.   Assessment and Plan:  Pregnancy: G4P1021 at 221w2d. Supervision of normal intrauterine pregnancy in multigravida, second trimester Continue PNVs as prescribed Well-balanced diet, adequate hydration, exercise as tolerated  2. Anemia during pregnancy in second trimester Continue FeSO4 '325mg'$   daily with juice Increase iron rich food intake Repeat Hgb on 02/16/22 improved and stable with treatment  3. Placenta previa antepartum in second trimester [facility] Repeat U/S at 32 weeks   Preterm labor symptoms and general obstetric precautions including but not limited to vaginal bleeding, contractions, leaking of fluid and fetal movement were reviewed in detail with the patient. Please refer to After Visit Summary for other counseling recommendations.  Return in about 4 weeks (around 04/13/2022) for routine prenatal care.  Future Appointments  Date Time Provider [place]3/26/2024  2:00 PM AC-MH PROVIDER AC-MAT None    KaMarline BackboneFNP

## 2022-04-02 NOTE — Addendum Note (Signed)
Addended by: Bradd Burner A on: 04/02/2022 09:24 AM   Modules accepted: Level of Service

## 2022-04-13 ENCOUNTER — Ambulatory Visit: Payer: Medicaid Other | Admitting: Advanced Practice Midwife

## 2022-04-13 VITALS — BP 97/58 | HR 70 | Temp 97.3°F | Wt 117.2 lb

## 2022-04-13 DIAGNOSIS — O0930 Supervision of pregnancy with insufficient antenatal care, unspecified trimester: Secondary | ICD-10-CM

## 2022-04-13 DIAGNOSIS — O4402 Placenta previa specified as without hemorrhage, second trimester: Secondary | ICD-10-CM

## 2022-04-13 DIAGNOSIS — O99019 Anemia complicating pregnancy, unspecified trimester: Secondary | ICD-10-CM

## 2022-04-13 DIAGNOSIS — Z3482 Encounter for supervision of other normal pregnancy, second trimester: Secondary | ICD-10-CM

## 2022-04-13 LAB — HEMOGLOBIN, FINGERSTICK: Hemoglobin: 11.2 g/dL (ref 11.1–15.9)

## 2022-04-13 NOTE — Progress Notes (Signed)
[place] [facility]  PRENATAL VISIT NOTE  Subjective:  Leah Ayers is a 32 y.o. [phone] at [redacted]w[redacted]d being seen today for ongoing prenatal care.  She is currently monitored for the following issues for this low-risk pregnancy and has Supervision of normal intrauterine pregnancy in multigravida, second trimester; Late prenatal care 14 4/7 wks; Anemia affecting pregnancy; Rubella non-immune status, antepartum; and Placenta previa antepartum in second trimester on their problem list.  Patient reports no complaints.  Contractions: Not present. Vag. Bleeding: None.  Movement: Present. Denies leaking of fluid/ROM.   The following portions of the patient's history were reviewed and updated as appropriate: allergies, current medications, past family history, past medical history, past social history, past surgical history and problem list. Problem list updated.  Objective:   Vitals:   04/13/22 1419  BP: (!) 97/58  Pulse: 70  Temp: (!) 97.3 F (36.3 C)  Weight: 117 lb 3.2 oz (53.2 kg)    Fetal Status:   Fundal Height: 27 cm Movement: Present     General:  Alert, oriented and cooperative. Patient is in no acute distress.  Skin: Skin is warm and dry. No rash noted.   Cardiovascular: Normal heart rate noted  Respiratory: Normal respiratory effort, no problems with respiration noted  Abdomen: Soft, gravid, appropriate for gestational age.  Pain/Pressure: Absent     Pelvic: Cervical exam deferred        Extremities: Normal range of motion.  Edema: None  Mental Status: Normal mood and affect. Normal behavior. Normal judgment and thought content.   Assessment and Plan:  Pregnancy: G4P1021 at [redacted]w[redacted]d  1. Supervision of normal intrauterine pregnancy in multigravida, second trimester 7 lb 3.2 oz (3.266 kg) 4 lb wt gain in past 4 wks Not working Walking 6x/wk x 30-40 min Reviewed 03/19/22 u/s at 23.0 wks with posterior placenta 8.03 cm from cx, AFI wnl,  anatomy wnl  2. Late prenatal care 14 4/7 wks   3. Anemia affecting pregnancy, antepartum Taking FeSo4 I daily with juice  - Hemoglobin, venipuncture  4. Placenta previa antepartum in second trimester Resolved on 03/19/22 u/s at 23.0 wks with AFI wnl, posterior placenta 8.03 cm from cx, anatomy wnl   Preterm labor symptoms and general obstetric precautions including but not limited to vaginal bleeding, contractions, leaking of fluid and fetal movement were reviewed in detail with the patient. Please refer to After Visit Summary for other counseling recommendations.  Return in about 2 weeks (around 04/27/2022) for routine PNC, 28 week labs.  No future appointments.  Herbie Saxon, CNM

## 2022-04-13 NOTE — Progress Notes (Signed)
Patient here for MH RV at [redacted]w[redacted]d.   Hgb (11.2) - reviewed.   Patient to continue taking iron x1 daily.   Al Decant, RN

## 2022-04-23 NOTE — Progress Notes (Unsigned)
[facility]  PRENATAL VISIT NOTE  Subjective:  Leah Ayers is a 32 y.o. [phone] at [redacted]w[redacted]d being seen today for ongoing prenatal care.  She is currently monitored for the following issues for this {Blank single:19197::"high-risk","low-risk"} pregnancy and has Supervision of normal intrauterine pregnancy in multigravida, second trimester; Late prenatal care 14 4/7 wks; Anemia affecting pregnancy; Rubella non-immune status, antepartum; and Placenta previa antepartum in second trimester--resolved on 03/19/22 u/s on their problem list.  Patient reports {sx:14538}.   .  .   . ***Denies leaking of fluid/ROM.   The following portions of the patient's history were reviewed and updated as appropriate: allergies, current medications, past family history, past medical history, past social history, past surgical history and problem list. Problem list updated.  Objective:  There were no vitals filed for this visit.  Fetal Status:           General:  Alert, oriented and cooperative. Patient is in no acute distress.  Skin: Skin is warm and dry. No rash noted.   Cardiovascular: Normal heart rate noted  Respiratory: Normal respiratory effort, no problems with respiration noted  Abdomen: Soft, gravid, appropriate for gestational age.        Pelvic: Cervical exam deferred        Extremities: Normal range of motion.     Mental Status: Normal mood and affect. Normal behavior. Normal judgment and thought content.   Assessment and Plan:  Pregnancy: G4P1021 at [redacted]w[redacted]d  1. Supervision of normal intrauterine pregnancy in multigravida, second trimester -taking PNV daily -weight -exercise  2. Placenta previa antepartum in second trimester--resolved on 03/19/22 u/s -order repeat US at next RV for ~32 weeks to eval placenta  3. Anemia affecting pregnancy in third trimester -taking iron daily with juice?  4. [redacted] weeks gestation of pregnancy -standard 28 week labs  today   Preterm labor symptoms and general obstetric precautions including but not limited to vaginal bleeding, contractions, leaking of fluid and fetal movement were reviewed in detail with the patient. Please refer to After Visit Summary for other counseling recommendations.  No follow-ups on file.  Future Appointments  Date Time Provider Department Center  04/27/2022  1:20 PM [facility] PROVIDER AC-[facility] None    Lenice Llamas, FNP

## 2022-04-27 ENCOUNTER — Ambulatory Visit: Payer: Medicaid Other | Admitting: Family Medicine

## 2022-04-27 ENCOUNTER — Encounter: Payer: Self-pay | Admitting: Family Medicine

## 2022-04-27 VITALS — BP 98/60 | HR 82 | Temp 96.8°F | Wt 119.4 lb

## 2022-04-27 DIAGNOSIS — Z3A28 28 weeks gestation of pregnancy: Secondary | ICD-10-CM

## 2022-04-27 DIAGNOSIS — Z23 Encounter for immunization: Secondary | ICD-10-CM | POA: Diagnosis not present

## 2022-04-27 DIAGNOSIS — Z3482 Encounter for supervision of other normal pregnancy, second trimester: Secondary | ICD-10-CM

## 2022-04-27 DIAGNOSIS — O99013 Anemia complicating pregnancy, third trimester: Secondary | ICD-10-CM

## 2022-04-27 DIAGNOSIS — Z3483 Encounter for supervision of other normal pregnancy, third trimester: Secondary | ICD-10-CM

## 2022-04-27 DIAGNOSIS — O4402 Placenta previa specified as without hemorrhage, second trimester: Secondary | ICD-10-CM

## 2022-04-27 DIAGNOSIS — O4403 Placenta previa specified as without hemorrhage, third trimester: Secondary | ICD-10-CM

## 2022-04-27 MED ORDER — PRE-NATAL FORMULA PO TABS: 1.0000 | ORAL_TABLET | Freq: Every day | ORAL | 6 refills | Status: AC

## 2022-04-27 NOTE — Progress Notes (Signed)
Pt here for MHRV at 28w 4d. 28 week labs done today along with 1 hour GTT. Hgb done 2 weeks ago (04/13/22) - per provider, no Hgb to be done today. Tdap given today along with VIS, tolerated well.  Martyn Malay, RN

## 2022-04-29 LAB — GLUCOSE, 1 HOUR GESTATIONAL: Gestational Diabetes Screen: 115 mg/dL (ref 70–139)

## 2022-04-29 LAB — HIV-1/HIV-2 QUALITATIVE RNA
HIV-1 RNA, Qualitative: NONREACTIVE
HIV-2 RNA, Qualitative: NONREACTIVE

## 2022-04-29 LAB — RPR: RPR Ser Ql: NONREACTIVE

## 2022-05-10 NOTE — Progress Notes (Unsigned)
[facility]  PRENATAL VISIT NOTE  Subjective:  Leah Ayers is a 32 y.o. [phone] at [redacted]w[redacted]d being seen today for ongoing prenatal care.  She is currently monitored for the following issues for this low-risk pregnancy and has Supervision of normal intrauterine pregnancy in multigravida, second trimester; Late prenatal care 14 4/7 wks; Anemia affecting pregnancy; Rubella non-immune status, antepartum; and Placenta previa antepartum in second trimester--resolved on 03/19/22 u/s on their problem list.  Patient reports {sx:14538}.   .  .   . Denies leaking of fluid/ROM.   The following portions of the patient's history were reviewed and updated as appropriate: allergies, current medications, past family history, past medical history, past social history, past surgical history and problem list. Problem list updated.  Objective:  There were no vitals filed for this visit.  Fetal Status:           General:  Alert, oriented and cooperative. Patient is in no acute distress.  Skin: Skin is warm and dry. No rash noted.   Cardiovascular: Normal heart rate noted  Respiratory: Normal respiratory effort, no problems with respiration noted  Abdomen: Soft, gravid, appropriate for gestational age.        Pelvic: Cervical exam deferred        Extremities: Normal range of motion.     Mental Status: Normal mood and affect. Normal behavior. Normal judgment and thought content.   Assessment and Plan:  Pregnancy: G4P1021 at [redacted]w[redacted]d  1. Supervision of normal intrauterine pregnancy in multigravida, second trimester -taking PNV daily? -weight -exercise  2. [redacted] weeks gestation of pregnancy   3. Anemia affecting pregnancy in third trimester -taking iron daily with juice? -last Hgb check on 3/26 was 11.2 -repeat Hgb today  4. Placenta previa antepartum in second trimester--resolved on 03/19/22 u/s -repeat US needed at 32 weeks to confirm placenta  location -ordered today?   Preterm labor symptoms and general obstetric precautions including but not limited to vaginal bleeding, contractions, leaking of fluid and fetal movement were reviewed in detail with the patient. Please refer to After Visit Summary for other counseling recommendations.  No follow-ups on file.  Future Appointments  Date Time Provider Department Center  05/11/2022  3:50 PM AC-MH PROVIDER AC-[facility] None    Lenice Llamas, FNP

## 2022-05-11 ENCOUNTER — Ambulatory Visit: Payer: Medicaid Other | Admitting: Family Medicine

## 2022-05-11 VITALS — BP 99/66 | HR 77 | Temp 97.2°F | Wt 120.6 lb

## 2022-05-11 DIAGNOSIS — O99013 Anemia complicating pregnancy, third trimester: Secondary | ICD-10-CM

## 2022-05-11 DIAGNOSIS — O4402 Placenta previa specified as without hemorrhage, second trimester: Secondary | ICD-10-CM

## 2022-05-11 DIAGNOSIS — Z3A30 30 weeks gestation of pregnancy: Secondary | ICD-10-CM

## 2022-05-11 DIAGNOSIS — Z3482 Encounter for supervision of other normal pregnancy, second trimester: Secondary | ICD-10-CM

## 2022-05-11 NOTE — Progress Notes (Unsigned)
Correctly verbalizes how to take iron and prenatal vitamin. Takes iron tablet with juice. Jossie Ng, RN [facility] [place] referral faxed with snapshot pages and fax confirmation received. Jossie Ng, RN

## 2022-05-17 ENCOUNTER — Telehealth: Payer: Self-pay

## 2022-05-17 NOTE — Telephone Encounter (Signed)
Call to [facility] to ascertain if [place] appt scheduled (referral for [place] in 2 weeks faxed 05/12/22). Per Luther Parody, [place] has not yet been scheduled. Jossie Ng, RN

## 2022-05-19 ENCOUNTER — Telehealth: Payer: Self-pay

## 2022-05-19 NOTE — Telephone Encounter (Signed)
Confirmed with [facility] that U/S scheduled for 05/20/2022 @ 4 pm. BTHIELE RN

## 2022-05-25 ENCOUNTER — Ambulatory Visit: Payer: Medicaid Other | Admitting: Advanced Practice Midwife

## 2022-05-25 VITALS — BP 92/54 | HR 70 | Temp 98.4°F | Wt 124.2 lb

## 2022-05-25 DIAGNOSIS — Z3482 Encounter for supervision of other normal pregnancy, second trimester: Secondary | ICD-10-CM

## 2022-05-25 DIAGNOSIS — O0930 Supervision of pregnancy with insufficient antenatal care, unspecified trimester: Secondary | ICD-10-CM

## 2022-05-25 DIAGNOSIS — O99013 Anemia complicating pregnancy, third trimester: Secondary | ICD-10-CM

## 2022-05-25 DIAGNOSIS — O4402 Placenta previa specified as without hemorrhage, second trimester: Secondary | ICD-10-CM

## 2022-05-25 LAB — HEMOGLOBIN, FINGERSTICK: Hemoglobin: 10.4 g/dL — ABNORMAL LOW (ref 11.1–15.9)

## 2022-05-25 NOTE — Progress Notes (Signed)
[facility]  PRENATAL VISIT NOTE  Subjective:  Leah Ayers is a 32 y.o. [phone] at [redacted]w[redacted]d being seen today for ongoing prenatal care.  She is currently monitored for the following issues for this low-risk pregnancy and has Supervision of normal intrauterine pregnancy in multigravida, second trimester; Late prenatal care 14 4/7 wks; Anemia affecting pregnancy; Rubella non-immune status, antepartum; and Placenta previa antepartum in second trimester--resolved on 03/19/22 u/s on their problem list.  Patient reports no complaints.  Contractions: Not present. Vag. Bleeding: None.  Movement: Present. Denies leaking of fluid/ROM.   The following portions of the patient's history were reviewed and updated as appropriate: allergies, current medications, past family history, past medical history, past social history, past surgical history and problem list. Problem list updated.  Objective:   Vitals:   05/25/22 0828  BP: (!) 92/54  Pulse: 70  Temp: 98.4 F (36.9 C)  Weight: 124 lb 3.2 oz (56.3 kg)    Fetal Status: Fetal Heart Rate (bpm): 143 Fundal Height: 30 cm Movement: Present  Presentation: Vertex  General:  Alert, oriented and cooperative. Patient is in no acute distress.  Skin: Skin is warm and dry. No rash noted.   Cardiovascular: Normal heart rate noted  Respiratory: Normal respiratory effort, no problems with respiration noted  Abdomen: Soft, gravid, appropriate for gestational age.  Pain/Pressure: Absent     Pelvic: Cervical exam deferred        Extremities: Normal range of motion.  Edema: None  Mental Status: Normal mood and affect. Normal behavior. Normal judgment and thought content.   Assessment and Plan:  Pregnancy: G4P1021 at [redacted]w[redacted]d  1. Supervision of normal intrauterine pregnancy in multigravida, second trimester 14 lb 3.2 oz (6.441 kg) 4 lb wt gain in last 2 wks Not working Walking 4x/wk x 20 min Had f/u u/s 05/20/22 at [facility]  but no report sent yet--RN to call and ask for copy to be faxed for review  2. Late prenatal care 14 4/7 wks   3. Placenta previa antepartum in second trimester--resolved on 03/19/22 u/s F/u u/s 05/20/22 but report not available for review--RN to call   4. Anemia affecting pregnancy in third trimester Taking FeSo4 I daily with juice Hgb today  - Hemoglobin, venipuncture   Preterm labor symptoms and general obstetric precautions including but not limited to vaginal bleeding, contractions, leaking of fluid and fetal movement were reviewed in detail with the patient. Please refer to After Visit Summary for other counseling recommendations.  Return in about 2 weeks (around 06/08/2022) for routine PNC.  No future appointments.  Alberteen Spindle, CNM

## 2022-05-25 NOTE — Progress Notes (Signed)
Hgb=10.4 today, counseled to continue taking 1 iron tablet each day with vitamin C rich juice.Burt Knack, RN

## 2022-05-25 NOTE — Progress Notes (Signed)
Patient here for MH RV at 32 4/7. Kick counts reviewed and cards given. Patient had [place] U/S on 05/20/22. Marland KitchenBurt Knack, RN

## 2022-05-25 NOTE — Progress Notes (Signed)
TC to [facility] for 05/20/22 U/S results. TC earlier and all that came through fax was cover sheet. 2nd call to request that they resend the U/S results. Burt Knack, RN

## 2022-06-08 ENCOUNTER — Ambulatory Visit: Payer: Medicaid Other | Admitting: Advanced Practice Midwife

## 2022-06-08 VITALS — BP 101/60 | HR 64 | Temp 97.1°F | Wt 126.6 lb

## 2022-06-08 DIAGNOSIS — O0933 Supervision of pregnancy with insufficient antenatal care, third trimester: Secondary | ICD-10-CM

## 2022-06-08 DIAGNOSIS — Z3483 Encounter for supervision of other normal pregnancy, third trimester: Secondary | ICD-10-CM

## 2022-06-08 DIAGNOSIS — Z3482 Encounter for supervision of other normal pregnancy, second trimester: Secondary | ICD-10-CM

## 2022-06-08 DIAGNOSIS — O4402 Placenta previa specified as without hemorrhage, second trimester: Secondary | ICD-10-CM

## 2022-06-08 DIAGNOSIS — O99013 Anemia complicating pregnancy, third trimester: Secondary | ICD-10-CM

## 2022-06-08 DIAGNOSIS — O0930 Supervision of pregnancy with insufficient antenatal care, unspecified trimester: Secondary | ICD-10-CM

## 2022-06-08 NOTE — Progress Notes (Addendum)
[facility]  PRENATAL VISIT NOTE  Subjective:  Leah Ayers is a 32 y.o. [phone] at [redacted]w[redacted]d being seen today for ongoing prenatal care.  She is currently monitored for the following issues for this low-risk pregnancy and has Supervision of normal intrauterine pregnancy in multigravida, second trimester; Late prenatal care 14 4/7 wks; Anemia affecting pregnancy; Rubella non-immune status, antepartum; and Placenta previa antepartum in second trimester--resolved on 03/19/22 u/s on their problem list.  Patient reports no complaints.  Contractions: Not present. Vag. Bleeding: None.  Movement: Present. Denies leaking of fluid/ROM.   The following portions of the patient's history were reviewed and updated as appropriate: allergies, current medications, past family history, past medical history, past social history, past surgical history and problem list. Problem list updated.  Objective:   Vitals:   06/08/22 0833  BP: 101/60  Pulse: 64  Temp: (!) 97.1 F (36.2 C)  Weight: 126 lb 9.6 oz (57.4 kg)    Fetal Status: Fetal Heart Rate (bpm): 139 Fundal Height: 32 cm Movement: Present  Presentation: Vertex  General:  Alert, oriented and cooperative. Patient is in no acute distress.  Skin: Skin is warm and dry. No rash noted.   Cardiovascular: Normal heart rate noted  Respiratory: Normal respiratory effort, no problems with respiration noted  Abdomen: Soft, gravid, appropriate for gestational age.  Pain/Pressure: Absent     Pelvic: Cervical exam deferred        Extremities: Normal range of motion.  Edema: None  Mental Status: Normal mood and affect. Normal behavior. Normal judgment and thought content.   Assessment and Plan:  Pregnancy: G4P1021 at [redacted]w[redacted]d  1. Placenta previa antepartum in second trimester--resolved on 03/19/22 u/s 05/20/22 u/s at [facility] report still not available--RN to call [facility] Placenta 14.1 cm from internal os, AFI=53%, vtx  2.  Supervision of normal intrauterine pregnancy in multigravida, second trimester 16 lb 9.6 oz (7.53 kg) 2 lb wt gain in last 2 wks Walking 4x/wk x 20 min Not working No car seat or crib yet  3. Late prenatal care 14 4/7 wks  4. Anemia affecting pregnancy in third trimester Taking FeSo4 I daily with oj Hgb 05/25/22=10.4   Preterm labor symptoms and general obstetric precautions including but not limited to vaginal bleeding, contractions, leaking of fluid and fetal movement were reviewed in detail with the patient. Please refer to After Visit Summary for other counseling recommendations.  Return in about 2 weeks (around 06/22/2022) for routine PNC.  No future appointments.  Alberteen Spindle, CNM

## 2022-06-08 NOTE — Progress Notes (Signed)
Here today for 34.4 week MH RV. Taking PNV and Iron QD. Denies ED/hospital visits since last RV. Needs [place] for placental location. Tawny Hopping, RN

## 2022-06-18 NOTE — Progress Notes (Unsigned)
[facility]  PRENATAL VISIT NOTE  Subjective:  Leah Ayers is a 32 y.o. [phone] at [redacted]w[redacted]d being seen today for ongoing prenatal care.  She is currently monitored for the following issues for this {Blank single:19197::"high-risk","low-risk"} pregnancy and has Supervision of normal intrauterine pregnancy in multigravida, second trimester; Late prenatal care 14 4/7 wks; Anemia affecting pregnancy; Rubella non-immune status, antepartum; and Placenta previa antepartum in second trimester--resolved on 03/19/22 u/s on their problem list.  Patient reports {sx:14538}.   .  .   . ***Denies leaking of fluid/ROM.   The following portions of the patient's history were reviewed and updated as appropriate: allergies, current medications, past family history, past medical history, past social history, past surgical history and problem list. Problem list updated.  Objective:  There were no vitals filed for this visit.  Fetal Status:           General:  Alert, oriented and cooperative. Patient is in no acute distress.  Skin: Skin is warm and dry. No rash noted.   Cardiovascular: Normal heart rate noted  Respiratory: Normal respiratory effort, no problems with respiration noted  Abdomen: Soft, gravid, appropriate for gestational age.        Pelvic: {Blank single:19197::"Cervical exam performed","Cervical exam deferred"}        Extremities: Normal range of motion.     Mental Status: Normal mood and affect. Normal behavior. Normal judgment and thought content.   Assessment and Plan:  Pregnancy: G4P1021 at [redacted]w[redacted]d  1. Supervision of normal intrauterine pregnancy in multigravida, second trimester -taking PNV daily? -weight -exercise  2. Anemia affecting pregnancy in third trimester -taking iron daily with juice?   3. [redacted] weeks gestation of pregnancy -standard 36 week cultures today   Preterm labor symptoms and general obstetric precautions including but  not limited to vaginal bleeding, contractions, leaking of fluid and fetal movement were reviewed in detail with the patient. Please refer to After Visit Summary for other counseling recommendations.  No follow-ups on file.  Future Appointments  Date Time Provider Department Center  06/22/2022  8:40 AM AC-MH PROVIDER AC-MAT None    Lenice Llamas, FNP

## 2022-06-22 ENCOUNTER — Ambulatory Visit: Payer: Medicaid Other | Admitting: Family Medicine

## 2022-06-22 ENCOUNTER — Encounter: Payer: Self-pay | Admitting: Family Medicine

## 2022-06-22 VITALS — BP 96/58 | HR 75 | Temp 97.8°F | Wt 129.6 lb

## 2022-06-22 DIAGNOSIS — Z3A36 36 weeks gestation of pregnancy: Secondary | ICD-10-CM

## 2022-06-22 DIAGNOSIS — Z3482 Encounter for supervision of other normal pregnancy, second trimester: Secondary | ICD-10-CM

## 2022-06-22 DIAGNOSIS — Z3483 Encounter for supervision of other normal pregnancy, third trimester: Secondary | ICD-10-CM

## 2022-06-22 DIAGNOSIS — O99013 Anemia complicating pregnancy, third trimester: Secondary | ICD-10-CM

## 2022-06-22 LAB — HEMOGLOBIN, FINGERSTICK: Hemoglobin: 11.4 g/dL (ref 11.1–15.9)

## 2022-06-22 LAB — OB RESULTS CONSOLE GC/CHLAMYDIA
Chlamydia: NEGATIVE
Neisseria Gonorrhea: NEGATIVE

## 2022-06-22 NOTE — Progress Notes (Addendum)
Correctly verbalizes how to take iron tablet and prenatal vitamin. Taking iron tablet with juice. Hgb due today. Declined self-collection of 36 week cultures. 1 week MHC RV appt scheduled for 06/29/22 and reminder card given to client. Jossie Ng, RN Hgb = 11.4 and to continue iron  / PNV as previously taking. Jossie Ng, RN

## 2022-06-25 LAB — CHLAMYDIA/GC NAA, CONFIRMATION
Chlamydia trachomatis, NAA: NEGATIVE
Neisseria gonorrhoeae, NAA: NEGATIVE

## 2022-06-26 LAB — CULTURE, BETA STREP (GROUP B ONLY): Strep Gp B Culture: NEGATIVE

## 2022-06-28 NOTE — Progress Notes (Unsigned)
[facility]  PRENATAL VISIT NOTE  Subjective:  Leah Ayers is a 32 y.o. [phone] at [redacted]w[redacted]d being seen today for ongoing prenatal care.  She is currently monitored for the following issues for this low-risk pregnancy and has Supervision of normal intrauterine pregnancy in multigravida, second trimester; Late prenatal care 14 4/7 wks; Anemia affecting pregnancy; Rubella non-immune status, antepartum; and Placenta previa antepartum in second trimester--resolved on 03/19/22 u/s on their problem list.  Patient reports {sx:[id number]}.   .  .   . Denies leaking of fluid/ROM.   The following portions of the patient's history were reviewed and updated as appropriate: allergies, current medications, past family history, past medical history, past social history, past surgical history and problem list. Problem list updated.  Objective:  There were no vitals filed for this visit.  Fetal Status:           General:  Alert, oriented and cooperative. Patient is in no acute distress.  Skin: Skin is warm and dry. No rash noted.   Cardiovascular: Normal heart rate noted  Respiratory: Normal respiratory effort, no problems with respiration noted  Abdomen: Soft, gravid, appropriate for gestational age.        Pelvic: {Blank single:19197::"Cervical exam performed","Cervical exam deferred"}        Extremities: Normal range of motion.     Mental Status: Normal mood and affect. Normal behavior. Normal judgment and thought content.   Assessment and Plan:  Pregnancy: [id number] at [redacted]w[redacted]d  1. Supervision of normal intrauterine pregnancy in multigravida, second trimester -taking PNV daily? -weight -exercise   2. Anemia affecting pregnancy in third trimester -taking iron daily with juice?   3. [redacted] weeks gestation of pregnancy Reviewed 36 week labs- wnl    Term labor symptoms and general obstetric precautions including but not limited to vaginal bleeding,  contractions, leaking of fluid and fetal movement were reviewed in detail with the patient. Please refer to After Visit Summary for other counseling recommendations.  No follow-ups on file.  Future Appointments  Date Time Provider Department Center  06/29/2022  1:40 PM [facility] PROVIDER [facility] None   Due to language barrier, interpreter *** was present for this visit.  Lenice Llamas, [place]

## 2022-06-29 ENCOUNTER — Ambulatory Visit: Payer: Medicaid Other | Admitting: Family Medicine

## 2022-06-29 VITALS — BP 103/65 | HR 78 | Temp 97.8°F | Wt 130.6 lb

## 2022-06-29 DIAGNOSIS — Z3A37 37 weeks gestation of pregnancy: Secondary | ICD-10-CM

## 2022-06-29 DIAGNOSIS — Z3483 Encounter for supervision of other normal pregnancy, third trimester: Secondary | ICD-10-CM

## 2022-06-29 DIAGNOSIS — O99013 Anemia complicating pregnancy, third trimester: Secondary | ICD-10-CM

## 2022-06-29 DIAGNOSIS — Z3482 Encounter for supervision of other normal pregnancy, second trimester: Secondary | ICD-10-CM

## 2022-06-29 NOTE — Progress Notes (Signed)
Patient here for MH RV at 37 4/7...Wanya Bangura Brewer-Jensen, RN 

## 2022-07-03 ENCOUNTER — Encounter: Payer: Self-pay | Admitting: Obstetrics and Gynecology

## 2022-07-03 ENCOUNTER — Other Ambulatory Visit: Payer: Self-pay

## 2022-07-03 ENCOUNTER — Inpatient Hospital Stay
Admission: EM | Admit: 2022-07-03 | Discharge: 2022-07-04 | DRG: 806 | Disposition: A | Discharge status: Home / Self Care | Payer: Medicaid Other

## 2022-07-03 DIAGNOSIS — Z87891 Personal history of nicotine dependence: Secondary | ICD-10-CM | POA: Diagnosis not present

## 2022-07-03 DIAGNOSIS — O4402 Placenta previa specified as without hemorrhage, second trimester: Secondary | ICD-10-CM

## 2022-07-03 DIAGNOSIS — Z3A38 38 weeks gestation of pregnancy: Secondary | ICD-10-CM

## 2022-07-03 DIAGNOSIS — O9081 Anemia of the puerperium: Secondary | ICD-10-CM | POA: Diagnosis not present

## 2022-07-03 DIAGNOSIS — D62 Acute posthemorrhagic anemia: Secondary | ICD-10-CM | POA: Diagnosis not present

## 2022-07-03 DIAGNOSIS — Z2839 Other underimmunization status: Secondary | ICD-10-CM

## 2022-07-03 DIAGNOSIS — Z3483 Encounter for supervision of other normal pregnancy, third trimester: Secondary | ICD-10-CM | POA: Diagnosis not present

## 2022-07-03 DIAGNOSIS — Z23 Encounter for immunization: Secondary | ICD-10-CM

## 2022-07-03 DIAGNOSIS — O26893 Other specified pregnancy related conditions, third trimester: Secondary | ICD-10-CM | POA: Diagnosis present

## 2022-07-03 DIAGNOSIS — O09899 Supervision of other high risk pregnancies, unspecified trimester: Secondary | ICD-10-CM

## 2022-07-03 DIAGNOSIS — O99013 Anemia complicating pregnancy, third trimester: Principal | ICD-10-CM

## 2022-07-03 DIAGNOSIS — O4290 Premature rupture of membranes, unspecified as to length of time between rupture and onset of labor, unspecified weeks of gestation: Secondary | ICD-10-CM | POA: Diagnosis present

## 2022-07-03 LAB — CBC
HCT: 38.5 % (ref 36.0–46.0)
Hemoglobin: 12.7 g/dL (ref 12.0–15.0)
MCH: 27.6 pg (ref 26.0–34.0)
MCHC: 33 g/dL (ref 30.0–36.0)
MCV: 83.7 fL (ref 80.0–100.0)
Platelets: 183 10*3/uL (ref 150–400)
RBC: 4.6 MIL/uL (ref 3.87–5.11)
RDW: 15.1 % (ref 11.5–15.5)
WBC: 8.6 10*3/uL (ref 4.0–10.5)
nRBC: 0 % (ref 0.0–0.2)

## 2022-07-03 LAB — TYPE AND SCREEN
ABO/RH(D): O POS
Antibody Screen: NEGATIVE

## 2022-07-03 LAB — RUPTURE OF MEMBRANE (ROM)PLUS: Rom Plus: POSITIVE

## 2022-07-03 MED ORDER — ONDANSETRON HCL 4 MG/2ML IJ SOLN: 4.0000 mg | Freq: Four times a day (QID) | INTRAMUSCULAR | Status: DC | PRN

## 2022-07-03 MED ORDER — MEASLES, MUMPS & RUBELLA VAC IJ SOLR
0.5000 mL | Freq: Once | INTRAMUSCULAR | Status: AC
  Administered 2022-07-04: 0.5 mL via SUBCUTANEOUS
  Filled 2022-07-03 (×2): qty 0.5

## 2022-07-03 MED ORDER — FERROUS SULFATE 325 (65 FE) MG PO TABS
325.0000 mg | ORAL_TABLET | Freq: Two times a day (BID) | ORAL | Status: DC
  Administered 2022-07-04 (×2): 325 mg via ORAL
  Filled 2022-07-03 (×2): qty 1

## 2022-07-03 MED ORDER — SODIUM CHLORIDE 0.9% FLUSH
3.0000 mL | Freq: Two times a day (BID) | INTRAVENOUS | Status: DC
  Administered 2022-07-03: 3 mL via INTRAVENOUS

## 2022-07-03 MED ORDER — ACETAMINOPHEN 500 MG PO TABS
1000.0000 mg | ORAL_TABLET | Freq: Four times a day (QID) | ORAL | Status: DC
  Administered 2022-07-03 – 2022-07-04 (×5): 1000 mg via ORAL
  Filled 2022-07-03 (×5): qty 2

## 2022-07-03 MED ORDER — AMMONIA AROMATIC IN INHA
RESPIRATORY_TRACT | Status: AC
  Filled 2022-07-03: qty 10

## 2022-07-03 MED ORDER — SENNOSIDES-DOCUSATE SODIUM 8.6-50 MG PO TABS
2.0000 | ORAL_TABLET | Freq: Every day | ORAL | Status: DC
  Administered 2022-07-04: 2 via ORAL
  Filled 2022-07-03: qty 2

## 2022-07-03 MED ORDER — FENTANYL CITRATE (PF) 100 MCG/2ML IJ SOLN
50.0000 ug | INTRAMUSCULAR | Status: DC | PRN
  Administered 2022-07-03: 100 ug via INTRAVENOUS
  Administered 2022-07-03: 50 ug via INTRAVENOUS
  Filled 2022-07-03 (×2): qty 2

## 2022-07-03 MED ORDER — TERBUTALINE SULFATE 1 MG/ML IJ SOLN: 0.2500 mg | Freq: Once | INTRAMUSCULAR | Status: DC | PRN

## 2022-07-03 MED ORDER — DIBUCAINE (PERIANAL) 1 % EX OINT
1.0000 | TOPICAL_OINTMENT | CUTANEOUS | Status: DC | PRN
  Filled 2022-07-03: qty 28

## 2022-07-03 MED ORDER — ONDANSETRON HCL 4 MG PO TABS: 4.0000 mg | ORAL_TABLET | ORAL | Status: DC | PRN

## 2022-07-03 MED ORDER — MISOPROSTOL 200 MCG PO TABS
ORAL_TABLET | ORAL | Status: AC
  Filled 2022-07-03: qty 4

## 2022-07-03 MED ORDER — OXYTOCIN BOLUS FROM INFUSION
333.0000 mL | Freq: Once | INTRAVENOUS | Status: AC
  Administered 2022-07-03: 333 mL via INTRAVENOUS

## 2022-07-03 MED ORDER — OXYTOCIN-SODIUM CHLORIDE 30-0.9 UT/500ML-% IV SOLN
1.0000 m[IU]/min | INTRAVENOUS | Status: DC
  Administered 2022-07-03: 1 m[IU]/min via INTRAVENOUS

## 2022-07-03 MED ORDER — SIMETHICONE 80 MG PO CHEW: 80.0000 mg | CHEWABLE_TABLET | ORAL | Status: DC | PRN

## 2022-07-03 MED ORDER — ONDANSETRON HCL 4 MG/2ML IJ SOLN: 4.0000 mg | INTRAMUSCULAR | Status: DC | PRN

## 2022-07-03 MED ORDER — SODIUM CHLORIDE 0.9% FLUSH: 3.0000 mL | INTRAVENOUS | Status: DC | PRN

## 2022-07-03 MED ORDER — COCONUT OIL OIL: 1.0000 | TOPICAL_OIL | Status: DC | PRN

## 2022-07-03 MED ORDER — OXYTOCIN-SODIUM CHLORIDE 30-0.9 UT/500ML-% IV SOLN
2.5000 [IU]/h | INTRAVENOUS | Status: DC
  Filled 2022-07-03: qty 500

## 2022-07-03 MED ORDER — IBUPROFEN 600 MG PO TABS
600.0000 mg | ORAL_TABLET | Freq: Four times a day (QID) | ORAL | Status: DC
  Administered 2022-07-03 – 2022-07-04 (×5): 600 mg via ORAL
  Filled 2022-07-03 (×5): qty 1

## 2022-07-03 MED ORDER — OXYTOCIN 10 UNIT/ML IJ SOLN
INTRAMUSCULAR | Status: AC
  Filled 2022-07-03: qty 2

## 2022-07-03 MED ORDER — LACTATED RINGERS IV SOLN: INTRAVENOUS | Status: DC

## 2022-07-03 MED ORDER — LACTATED RINGERS IV SOLN: 500.0000 mL | INTRAVENOUS | Status: DC | PRN

## 2022-07-03 MED ORDER — SOD CITRATE-CITRIC ACID 500-334 MG/5ML PO SOLN: 30.0000 mL | ORAL | Status: DC | PRN

## 2022-07-03 MED ORDER — ZOLPIDEM TARTRATE 5 MG PO TABS: 5.0000 mg | ORAL_TABLET | Freq: Every evening | ORAL | Status: DC | PRN

## 2022-07-03 MED ORDER — SODIUM CHLORIDE 0.9 % IV SOLN: 250.0000 mL | INTRAVENOUS | Status: DC | PRN

## 2022-07-03 MED ORDER — LIDOCAINE HCL (PF) 1 % IJ SOLN
30.0000 mL | INTRAMUSCULAR | Status: DC | PRN
  Filled 2022-07-03: qty 30

## 2022-07-03 MED ORDER — PRENATAL MULTIVITAMIN CH: 1.0000 | ORAL_TABLET | Freq: Every day | ORAL | Status: DC

## 2022-07-03 MED ORDER — WITCH HAZEL-GLYCERIN EX PADS
1.0000 | MEDICATED_PAD | CUTANEOUS | Status: DC | PRN
  Filled 2022-07-03: qty 100

## 2022-07-03 MED ORDER — DIPHENHYDRAMINE HCL 25 MG PO CAPS: 25.0000 mg | ORAL_CAPSULE | Freq: Four times a day (QID) | ORAL | Status: DC | PRN

## 2022-07-03 MED ORDER — ACETAMINOPHEN 325 MG PO TABS: 650.0000 mg | ORAL_TABLET | ORAL | Status: DC | PRN

## 2022-07-03 MED ORDER — BENZOCAINE-MENTHOL 20-0.5 % EX AERO
1.0000 | INHALATION_SPRAY | CUTANEOUS | Status: DC | PRN
  Filled 2022-07-03: qty 56

## 2022-07-03 NOTE — H&P (Signed)
OB History & Physical   History of Present Illness:   Chief Complaint: rupture of membranes   HPI:  Grisel Travassos is a 32 y.o. [phone] female at [redacted]w[redacted]d, Patient's last menstrual period was 10/13/2021 (exact date)., not consistent with [place] at [redacted]w[redacted]d, with Estimated Date of Delivery: 07/16/22.  She presents to L&D for rupture of membranes. She reports a large gush of clear fluid at 0430 this morning.  Contractions started shortly after.  She denies vaginal bleeding.  Endorses good fetal movement.   Reports active fetal movement  Contractions: every 2 to 4 minutes starting around 0500 LOF/SROM: clear fluid at 0430 Vaginal bleeding: denies   Factors complicating pregnancy:  Late prenatal care  Rubella non-immune Maternal iron-deficiency anemia in pregnancy  History of PPH requiring blood transfusion   Patient Active Problem List   Diagnosis Date Noted   Amniotic fluid leaking 07/03/2022   Placenta previa antepartum in second trimester--resolved on 03/19/22 u/s 02/25/2022   Rubella non-immune status, antepartum 01/21/2022   Supervision of normal intrauterine pregnancy in multigravida, second trimester 01/19/2022   Late prenatal care 14 4/7 wks 01/19/2022   Anemia affecting pregnancy 01/19/2022    Prenatal Transfer Tool  Maternal Diabetes: No Genetic Screening: Declined Maternal Ultrasounds/Referrals: Normal Fetal Ultrasounds or other Referrals:  None Maternal Substance Abuse:  No Significant Maternal Medications:  None Significant Maternal Lab Results: Group B Strep negative  Maternal Medical History:   Past Medical History:  Diagnosis Date   Patient denies medical problems     Past Surgical History:  Procedure Laterality Date   denies      No Known Allergies  Prior to Admission medications   Medication Sig Start Date End Date Taking? Authorizing Provider  Iron, Ferrous Sulfate, 325 (65 Fe) MG TABS Take 325 mg by mouth daily. 01/19/22   Federico Flake, MD   Prenatal Multivit-Min-Fe-FA (PRE-NATAL FORMULA) TABS Take 1 tablet by mouth daily. 04/27/22   Lenice Llamas, FNP     Prenatal care site:  [facility]  OB History  Gravida Para Term Preterm AB Living  4 1 1  0 2 1  SAB IAB Ectopic Multiple Live Births  2 0 0 0 1    # Outcome Date GA Lbr Len/2nd Weight Sex Delivery Anes PTL Lv  4 Current           3 SAB 2021 [redacted]w[redacted]d         2 SAB 2021 [redacted]w[redacted]d         1 Term 10/26/08   3000 g M Vag-Spont        Social History: She  reports that she quit smoking about 8 years ago. Her smoking use included cigarettes. She has never been exposed to tobacco smoke. She has never used smokeless tobacco. She reports that she does not currently use alcohol after a past usage of about 2.0 standard drinks of alcohol per week. She reports that she does not use drugs.  Family History: family history includes Healthy in her brother, brother, brother, brother, father, mother, sister, sister, sister, sister, sister, and son.   Review of Systems: A full review of systems was performed and negative except as noted in the HPI.     Physical Exam:  Vital Signs: BP 100/66 (BP Location: Left Arm)   Pulse 67   Temp 97.9 F (36.6 C) (Oral)   Resp 16   Ht 5\' 2"  (1.575 m)   Wt 59 kg Comment: 130lbs  LMP 10/13/2021 (Exact Date)  BMI 23.78 kg/m   General: no acute distress.  HEENT: normocephalic, atraumatic Heart: regular rate & rhythm Lungs: normal respiratory effort Abdomen: soft, gravid, non-tender;  EFW: 6 lbs  Pelvic:   External: Normal external female genitalia  Cervix: Dilation: 2 / Effacement (%): 80 / Station: -2    Extremities: non-tender, symmetric, NO edema bilaterally.  DTRs: 2+/2+  Neurologic: Alert & oriented x 3.    Results for orders placed or performed during the hospital encounter of 07/03/22 (from the past 24 hour(s))  ROM Plus ([facility] only)     Status: None   Collection Time: 07/03/22  5:51 AM  Result Value Ref Range   Rom Plus POSITIVE   CBC      Status: None   Collection Time: 07/03/22  8:50 AM  Result Value Ref Range   WBC 8.6 4.0 - 10.5 K/uL   RBC 4.60 3.87 - 5.11 MIL/uL   Hemoglobin 12.7 12.0 - 15.0 g/dL   HCT 16.1 09.6 - 04.5 %   MCV 83.7 80.0 - 100.0 fL   MCH 27.6 26.0 - 34.0 pg   MCHC 33.0 30.0 - 36.0 g/dL   RDW 40.9 81.1 - 91.4 %   Platelets 183 150 - 400 K/uL   nRBC 0.0 0.0 - 0.2 %  Type and screen [facility]     Status: None   Collection Time: 07/03/22  8:50 AM  Result Value Ref Range   ABO/RH(D) O POS    Antibody Screen NEG    Sample Expiration      07/06/2022,2359 Performed at [facility], [address], [place], [place] [address]     Pertinent Results:  Prenatal Labs: Blood type/Rh O POS   Antibody screen Negative    Rubella <0.90 (01/02 1633)   Varicella Immune  RPR Non Reactive (04/09 1445)   HBsAg Negative (01/02 1633)  Hep C NR   HIV Non Reactive (01/02 1633)   GC neg  Chlamydia neg  Genetic screening Declined   1 hour GTT 115  3 hour GTT N/A  GBS Negative/-- (06/04 0900)    FHT:  FHR: 140 bpm, variability: moderate,  accelerations:  Present,  decelerations:  Present intermittent variables  Category/reactivity:  Category II UC:   regular, every 2-4 minutes   Cephalic by [place] and SVE   No results found.  Assessment:  Breshae Manieri is a 33 y.o. [phone] female at [redacted]w[redacted]d with SROM.   Plan:  1. Admit to Labor & Delivery - consents reviewed and obtained - Dr. Dalbert Garnet notified of admission and plan of care   2. Fetal Well being  - Fetal Tracing: cat II - overall reassuring with moderate variability and accels  - Group B Streptococcus ppx not indicated: GBS negative - Presentation: cephalic confirmed by SVE   3. Routine OB: - Prenatal labs reviewed, as above - Rh positive - CBC, T&S, RPR on admit -  Labor diet , saline lock  4. Monitoring of labor  - Contractions monitored with external toco - Pelvis proven to 3000 grams, adequate for trial  of labor  - Plan for expectant management  - Augmentation with oxytocin as appropriate  - Plan for  continuous fetal monitoring - Maternal pain control as desired; planning unmedicated labor support options  - Anticipate vaginal delivery  5. Post Partum Planning: - Infant feeding: breast and formula feeding - Contraception: Depo-Provera - Flu vaccine: Given prenatally - Tdap vaccine: Given prenatally - RSV vaccine:  not  indicated   Gustavo Lah, [place] 07/03/22 10:38 AM  Margaretmary Eddy, CNM Certified Nurse Midwife [place]  [facility]

## 2022-07-03 NOTE — Progress Notes (Signed)
L&D Note    Due to language barrier, an interpreter was present during the history-taking and subsequent discussion (and for part of the physical exam) with this patient.  Subjective:  Contractions are starting to feel more intense an closer together   Objective:   Vitals:   07/03/22 0540 07/03/22 0541 07/03/22 0803 07/03/22 0822  BP:  107/69  100/66  Pulse:  61  67  Resp:    16  Temp:    97.9 F (36.6 C)  TempSrc:    Oral  Weight: 59 kg  59 kg   Height: 5\' 2"  (1.575 m)  5\' 2"  (1.575 m)     Current Vital Signs 24h Vital Sign Ranges  T 97.9 F (36.6 C) Temp  Avg: 97.9 F (36.6 C)  Min: 97.9 F (36.6 C)  Max: 97.9 F (36.6 C)  BP 100/66 BP  Min: 100/66  Max: 107/69  HR 67 Pulse  Avg: 64  Min: 61  Max: 67  RR 16 Resp  Avg: 16  Min: 16  Max: 16  SaO2     No data recorded      Gen: alert, cooperative, no distress FHR: Baseline: 140 bpm, Variability: moderate, Accels: Present, Decels: variable - intermittent  Toco: regular, every 2-4 minutes SVE: Dilation: 2 Effacement (%): 80 Station: -2 Presentation: Vertex Exam by:: A Falon Huesca CNM  Medications SCHEDULED MEDICATIONS   ammonia       misoprostol       oxytocin       oxytocin 40 units in LR 1000 mL  333 mL Intravenous Once    MEDICATION INFUSIONS   lactated ringers     lactated ringers 125 mL/hr at 07/03/22 0850   oxytocin      PRN MEDICATIONS  acetaminophen, ammonia, fentaNYL (SUBLIMAZE) injection, lactated ringers, lidocaine (PF), misoprostol, ondansetron, oxytocin, sodium citrate-citric acid   Assessment & Plan:  32 y.o. [id number] at [redacted]w[redacted]d admitted for SROM -Labor: Early latent labor.  Cervical change from previous exam -Fetal Well-being: Category II for intermittent variables. Overall reassuring with moderate variability and accels  -GBS: negative -Membranes ruptured, clear fluid, spontaneously at 0430 -Expectant management. -Analgesia: unmedicated labor support options    Gustavo Lah, CNM  07/03/2022 10:20  AM  [facility]

## 2022-07-03 NOTE — Lactation Note (Signed)
This note was copied from a baby's chart. Lactation Consultation Note  Patient Name: Leah Ayers [id number] Date: 07/03/2022 Age:32 hours Reason for consult: L&D Initial assessment;1st time breastfeeding;Early term 37-38.6wks;Infant < 6lbs  Ipad spanish interpreter services utilized.  Maternal Data Has patient been taught Hand Expression?: Yes Does the patient have breastfeeding experience prior to this delivery?: Yes How long did the patient breastfeed?: 2-3 mths This child is 15 yrs old Feeding Mother's Current Feeding Choice: Breast Milk and Formula Baby latched after a few attempts on left breast in cradle hold, occ swallow noted, mom states she has been able to express colostrum, encouraged to offer both breasts every 2-3 hr or at least 8x in 24 hrs, continues to nurse at 10 min when room left  LATCH Score Latch: Grasps breast easily, tongue down, lips flanged, rhythmical sucking.  Audible Swallowing: A few with stimulation  Type of Nipple: Everted at rest and after stimulation  Comfort (Breast/Nipple): Soft / non-tender  Hold (Positioning): Assistance needed to correctly position infant at breast and maintain latch.  LATCH Score: 8   Lactation Tools Discussed/Used    Interventions Interventions: Breast feeding basics reviewed;Assisted with latch;Skin to skin;Hand express;Support pillows;Education  Discharge WIC Program: No  Consult Status Consult Status: Follow-up from L&D    Leah Ayers 07/03/2022, 4:17 PM

## 2022-07-03 NOTE — Plan of Care (Signed)
PP

## 2022-07-03 NOTE — Discharge Summary (Signed)
Obstetrical Discharge Summary  Patient Name: Leah Ayers DOB: Jan 08, 1991 MRN: [id number]  Date of Admission: 07/03/2022 Date of Delivery: 07/03/2022 Delivered by: Margaretmary Eddy, CNM  Date of Discharge: 07/04/2022  [facility] [id number] last menstrual period was 10/13/2021 (exact date). EDC Estimated Date of Delivery: 07/16/22 Gestational Age at Delivery: [redacted]w[redacted]d   Antepartum complications:  Late prenatal care  Rubella non-immune Maternal iron-deficiency anemia in pregnancy  History of PPH requiring blood transfusion   Admitting Diagnosis: Amniotic fluid leaking [O42.90]  Secondary Diagnosis: Patient Active Problem List   Diagnosis Date Noted   Amniotic fluid leaking 07/03/2022   Placenta previa antepartum in second trimester--resolved on 03/19/22 u/s 02/25/2022   Rubella non-immune status, antepartum 01/21/2022   Supervision of normal intrauterine pregnancy in multigravida, second trimester 01/19/2022   Late prenatal care 14 4/7 wks 01/19/2022   Anemia affecting pregnancy 01/19/2022    Discharge Diagnosis: Term Pregnancy Delivered      Augmentation: Pitocin Complications: None Intrapartum complications/course:  Tootsie presented to L&D with SROM. She was initially expectantly managed.  Low dose oxytocin was initiated for augmentation after several hours of slow cervical change. She progressed to C/C/+2 with a strong urge to push.  She pushed quickly over approximately 4 minutes for a spontaneous vaginal birth.  Delivery Type: spontaneous vaginal delivery Anesthesia: IV narcotics Placenta: spontaneous To Pathology: No  Laceration: periurethral - repair not indicated  Episiotomy: none Newborn Data: Live born female  Birth Weight: 5 lb 13.1 oz (2640 g) APGAR: 9, 9  Newborn Delivery   Birth date/time: 07/03/2022 15:31:00 Delivery type: Vaginal, Spontaneous      Postpartum Procedures: none Edinburgh:     07/03/2022    9:00 PM  Edinburgh Postnatal  Depression Scale Screening Tool  I have been able to laugh and see the funny side of things. 0  I have looked forward with enjoyment to things. 0  I have blamed myself unnecessarily when things went wrong. 0  I have been anxious or worried for no good reason. 0  I have felt scared or panicky for no good reason. 0  Things have been getting on top of me. 2  I have been so unhappy that I have had difficulty sleeping. 0  I have felt sad or miserable. 0  I have been so unhappy that I have been crying. 0  The thought of harming myself has occurred to me. 0  Edinburgh Postnatal Depression Scale Total 2     Post partum course:  Patient had an uncomplicated postpartum course.  By time of discharge on PPD#1, her pain was controlled on oral pain medications; she had appropriate lochia and was ambulating, voiding without difficulty and tolerating regular diet.  She was deemed stable for discharge to home.    Discharge Physical Exam:  BP (!) 91/57 (BP Location: Right Arm)   Pulse 74   Temp 98.5 F (36.9 C) (Oral)   Resp 20   Ht 5\' 2"  (1.575 m)   Wt 59 kg Comment: 130lbs  LMP 10/13/2021 (Exact Date)   SpO2 99%   Breastfeeding Unknown   BMI 23.78 kg/m   General: NAD CV: RRR Pulm: CTABL, nl effort ABD: s/nd/nt, fundus firm and below the umbilicus Lochia: moderate Perineum: minimal edema/laceration hemostatic DVT Evaluation: LE non-ttp, no evidence of DVT on exam.  Hemoglobin  Date Value Ref Range Status  07/04/2022 9.7 (L) 12.0 - 15.0 g/dL Final  29/56/2130 86.5 11.1 - 15.9 g/dL Final   HCT  Date Value Ref Range Status  07/04/2022 29.9 (L) 36.0 - 46.0 % Final   Hematocrit  Date Value Ref Range Status  01/19/2022 35.4 34.0 - 46.6 % Final    Risk assessment for postpartum VTE and prophylactic treatment: Very high risk factors: None High risk factors: None Moderate risk factors: None  Postpartum VTE prophylaxis with LMWH not indicated  Disposition: stable, discharge to  home. Baby Feeding: breast and formula feeding Baby Disposition: home with mom  Rh Immune globulin indicated: No Rubella vaccine given: was offered prior to discharge  Varivax vaccine given: was not indicated Flu vaccine given in AP setting: Yes  Tdap vaccine given in AP setting: Yes   Contraception: Depo-Provera  Prenatal Labs:  Blood type/Rh O POS   Antibody screen Negative    Rubella <0.90 (01/02 1633)   Varicella Immune  RPR Non Reactive (04/09 1445)   HBsAg Negative (01/02 1633)  Hep C NR   HIV Non Reactive (01/02 1633)   GC neg  Chlamydia neg  Genetic screening Declined   1 hour GTT 115  3 hour GTT N/A  GBS Negative/-- (06/04 0900)     Plan:  Rakyia Mullenix was discharged to home in good condition. Follow-up appointment with prenatal provider in 6 weeks.  Discharge Medications: Allergies as of 07/04/2022   No Known Allergies      Medication List     TAKE these medications    acetaminophen 500 MG tablet Commonly known as: TYLENOL Take 2 tablets (1,000 mg total) by mouth every 6 (six) hours.   ibuprofen 600 MG tablet Commonly known as: ADVIL Take 1 tablet (600 mg total) by mouth every 6 (six) hours as needed for mild pain or cramping.   Iron (Ferrous Sulfate) 325 (65 Fe) MG Tabs Take 325 mg by mouth daily.   Pre-Natal Formula Tabs Take 1 tablet by mouth daily.         Follow-up Information     [facility] DEPT. Schedule an appointment as soon as possible for a visit in 6 week(s).   Why: postpartum visit Contact information: [address] [place] [address] [phone]                Signed:  Margaretmary Eddy, CNM Certified Nurse Midwife [facility] [facility]

## 2022-07-03 NOTE — Discharge Instructions (Signed)
Vaginal Delivery, Care After Refer to this sheet in the next few weeks. These discharge instructions provide you with information on caring for yourself after delivery. Your caregiver may also give you specific instructions. Your treatment has been planned according to the most current medical practices available, but problems sometimes occur. Call your caregiver if you have any problems or questions after you go home. HOME CARE INSTRUCTIONS Take over-the-counter or prescription medicines only as directed by your caregiver or pharmacist. Do not drink alcohol, especially if you are breastfeeding or taking medicine to relieve pain. Do not smoke tobacco. Continue to use good perineal care. Good perineal care includes: Wiping your perineum from back to front Keeping your perineum clean. You can do sitz baths twice a day, to help keep this area clean Do not use tampons, douche or have sex until your caregiver says it is okay. Shower only and avoid sitting in submerged water, aside from sitz baths Wear a well-fitting bra that provides breast support. Eat healthy foods. Drink enough fluids to keep your urine clear or pale yellow. Eat high-fiber foods such as whole grain cereals and breads, brown rice, beans, and fresh fruits and vegetables every day. These foods may help prevent or relieve constipation. Avoid constipation with high fiber foods or medications, such as miralax or metamucil Follow your caregiver's recommendations regarding resumption of activities such as climbing stairs, driving, lifting, exercising, or traveling. Talk to your caregiver about resuming sexual activities. Resumption of sexual activities is dependent upon your risk of infection, your rate of healing, and your comfort and desire to resume sexual activity. Try to have someone help you with your household activities and your newborn for at least a few days after you leave the hospital. Rest as much as possible. Try to rest or  take a nap when your newborn is sleeping. Increase your activities gradually. Keep all of your scheduled postpartum appointments. It is very important to keep your scheduled follow-up appointments. At these appointments, your caregiver will be checking to make sure that you are healing physically and emotionally. SEEK MEDICAL CARE IF:  You are passing large clots from your vagina. Save any clots to show your caregiver. You have a foul smelling discharge from your vagina. You have trouble urinating. You are urinating frequently. You have pain when you urinate. You have a change in your bowel movements. You have increasing redness, pain, or swelling near your vaginal incision (episiotomy) or vaginal tear. You have pus draining from your episiotomy or vaginal tear. Your episiotomy or vaginal tear is separating. You have painful, hard, or reddened breasts. You have a severe headache. You have blurred vision or see spots. You feel sad or depressed. You have thoughts of hurting yourself or your newborn. You have questions about your care, the care of your newborn, or medicines. You are dizzy or light-headed. You have a rash. You have nausea or vomiting. You were breastfeeding and have not had a menstrual period within 12 weeks after you stopped breastfeeding. You are not breastfeeding and have not had a menstrual period by the 12th week after delivery. You have a fever. SEEK IMMEDIATE MEDICAL CARE IF:  You have persistent pain. You have chest pain. You have shortness of breath. You faint. You have leg pain. You have stomach pain. Your vaginal bleeding saturates two or more sanitary pads in 1 hour. MAKE SURE YOU:  Understand these instructions. Will watch your condition. Will get help right away if you are not doing well or   get worse. Document Released: 01/02/2000 Document Revised: 05/21/2013 Document Reviewed: 09/01/2011 ExitCare Patient Information 2015 [employer]. This  information is not intended to replace advice given to you by your health care provider. Make sure you discuss any questions you have with your health care provider.  Sitz Bath A sitz bath is a warm water bath taken in the sitting position. The water covers only the hips and butt (buttocks). We recommend using one that fits in the toilet, to help with ease of use and cleanliness. It may be used for either healing or cleaning purposes. Sitz baths are also used to relieve pain, itching, or muscle tightening (spasms). The water may contain medicine. Moist heat will help you heal and relax.  HOME CARE  Take 3 to 4 sitz baths a day. Fill the bathtub half-full with warm water. Sit in the water and open the drain a little. Turn on the warm water to keep the tub half-full. Keep the water running constantly. Soak in the water for 15 to 20 minutes. After the sitz bath, pat the affected area dry. GET HELP RIGHT AWAY IF: You get worse instead of better. Stop the sitz baths if you get worse. MAKE SURE YOU: Understand these instructions. Will watch your condition. Will get help right away if you are not doing well or get worse. Document Released: 02/12/2004 Document Revised: 09/29/2011 Document Reviewed: 05/04/2010 ExitCare Patient Information 2015 [employer]. This information is not intended to replace advice given to you by your health care provider. Make sure you discuss any questions you have with your health care provider.   

## 2022-07-03 NOTE — Progress Notes (Signed)
L&D Note    Subjective:  Breathing well with contractions, requesting pain medication   Objective:   Vitals:   07/03/22 0541 07/03/22 0803 07/03/22 0822 07/03/22 1154  BP: 107/69  100/66 98/70  Pulse: 61  67 89  Resp:   16 16  Temp:   97.9 F (36.6 C) 98.1 F (36.7 C)  TempSrc:   Oral Oral  Weight:  59 kg    Height:  5\' 2"  (1.575 m)      Current Vital Signs 24h Vital Sign Ranges  T 98.1 F (36.7 C) Temp  Avg: 98 F (36.7 C)  Min: 97.9 F (36.6 C)  Max: 98.1 F (36.7 C)  BP 98/70 BP  Min: 98/70  Max: 107/69  HR 89 Pulse  Avg: 72.3  Min: 61  Max: 89  RR 16 Resp  Avg: 16  Min: 16  Max: 16  SaO2     No data recorded       FHR: Baseline: 130 bpm, Variability: moderate, Accels: Present, Decels: none Toco: regular, every 2-6 minutes SVE: Dilation: 3.5 Effacement (%): 80, 90 Station: -1 Presentation: Vertex Exam by:: J Braddy RN  Medications SCHEDULED MEDICATIONS   ammonia       misoprostol       oxytocin       oxytocin 40 units in LR 1000 mL  333 mL Intravenous Once    MEDICATION INFUSIONS   lactated ringers     lactated ringers 125 mL/hr at 07/03/22 0850   oxytocin     oxytocin      PRN MEDICATIONS  acetaminophen, ammonia, fentaNYL (SUBLIMAZE) injection, lactated ringers, lidocaine (PF), misoprostol, ondansetron, oxytocin, sodium citrate-citric acid, terbutaline   Assessment & Plan:  32 y.o. [id number] at [redacted]w[redacted]d admitted for SROM -Labor: Early latent labor. Slow cervical change since last exam.  Contractions mostly 2-6 minutes with periods of time that contractions are less frequent.  Radine is ambulating well in room and utilizing methods to help encourage continued labor.  -Fetal Well-being: Category I -GBS: negative -Membranes ruptured, clear fluid, spontaneously at 0430  -Augmentation: IV Pitocin augmentation. -Analgesia: IVPM   Gustavo Lah, CNM  07/03/2022 1:59 PM  [facility]

## 2022-07-04 ENCOUNTER — Encounter: Payer: Self-pay | Admitting: Obstetrics and Gynecology

## 2022-07-04 LAB — CBC
HCT: 29.9 % — ABNORMAL LOW (ref 36.0–46.0)
Hemoglobin: 9.7 g/dL — ABNORMAL LOW (ref 12.0–15.0)
MCH: 27.7 pg (ref 26.0–34.0)
MCHC: 32.4 g/dL (ref 30.0–36.0)
MCV: 85.4 fL (ref 80.0–100.0)
Platelets: 154 10*3/uL (ref 150–400)
RBC: 3.5 MIL/uL — ABNORMAL LOW (ref 3.87–5.11)
RDW: 15.1 % (ref 11.5–15.5)
WBC: 10.2 10*3/uL (ref 4.0–10.5)
nRBC: 0 % (ref 0.0–0.2)

## 2022-07-04 LAB — RPR: RPR Ser Ql: NONREACTIVE

## 2022-07-04 MED ORDER — ACETAMINOPHEN 500 MG PO TABS: 1000.0000 mg | ORAL_TABLET | Freq: Four times a day (QID) | ORAL | 0 refills | Status: AC

## 2022-07-04 MED ORDER — IBUPROFEN 600 MG PO TABS: 600.0000 mg | ORAL_TABLET | Freq: Four times a day (QID) | ORAL | 0 refills | Status: AC | PRN

## 2022-07-04 NOTE — Progress Notes (Signed)
Postpartum Day  1  Due to language barrier, an interpreter was present during the history-taking and subsequent discussion (and for part of the physical exam) with this patient.  Subjective: no complaints, up ad lib, voiding, and tolerating PO  Doing well, no concerns. Ambulating without difficulty, pain managed with PO meds, tolerating regular diet, and voiding without difficulty.   No fever/chills, chest pain, shortness of breath, nausea/vomiting, or leg pain. No nipple or breast pain. No headache, visual changes, or RUQ/epigastric pain.  Objective: BP (!) 91/57 (BP Location: Right Arm)   Pulse 74   Temp 98.5 F (36.9 C) (Oral)   Resp 20   Ht 5\' 2"  (1.575 m)   Wt 59 kg Comment: 130lbs  LMP 10/13/2021 (Exact Date)   SpO2 99%   Breastfeeding Unknown   BMI 23.78 kg/m    Physical Exam:  General: alert, cooperative, and no distress Breasts: soft/nontender Pulm: nl effort, CTABL Abdomen: soft, non-tender, active bowel sounds Uterine Fundus: firm Perineum: minimal edema, laceration hemostatic Lochia: appropriate DVT Evaluation: No evidence of DVT seen on physical exam.  Recent Labs    07/03/22 0850 07/04/22 0555  HGB 12.7 9.7*  HCT 38.5 29.9*  WBC 8.6 10.2  PLT 183 154    Assessment/Plan: 32 y.o. [id number] postpartum day # 1  -Continue routine postpartum care -Lactation consult PRN for breastfeeding. Supplementing with formula.  Encouraged to breastfeed frequently and to latch baby first.  If she desires to supplement then recommend supplementing with small amounts after breast feeds.   -Acute blood loss anemia - hemodynamically stable and asymptomatic; start PO ferrous sulfate BID with stool softeners  -Immunization status:   needs MMR prior to discharge   Disposition: Continue inpatient postpartum care   LOS: 1 day   Gustavo Lah, CNM 07/04/2022, 8:56 AM   ----- Margaretmary Eddy  Certified Nurse Midwife [facility]

## 2022-07-04 NOTE — Progress Notes (Signed)
Pt verb understanding of d/c instructions  using interpreter/D/C home with family

## 2022-07-07 ENCOUNTER — Ambulatory Visit: Payer: Medicaid Other

## 2022-08-02 ENCOUNTER — Telehealth: Payer: Self-pay

## 2022-08-02 NOTE — Telephone Encounter (Signed)
Call received from client and post-partum appt scheduled for 08/12/22 with arrival time of 0900. Roddie Mc Yemen interpreted during call. (Scheduled in [facility] appt as no upcoming timely appts available in [facility]). Jossie Ng, RN d

## 2022-08-02 NOTE — Telephone Encounter (Signed)
Please refer to previously opened phone encounter 08/02/22. Jossie Ng, RN

## 2022-08-07 IMAGING — US US OB < 14 WEEKS - US OB TV
1 series · 13 of 28 positions shown · non-contrast
Comparison: None.

CLINICAL DATA: Bleeding, quantitative beta hCG = 498

EXAM:
OBSTETRIC <14 WK US AND TRANSVAGINAL OB US
TECHNIQUE: Both transabdominal and transvaginal ultrasound examinations were
performed for complete evaluation of the gestation as well as the
maternal uterus, adnexal regions, and pelvic cul-de-sac.
Transvaginal technique was performed to assess early pregnancy.

[Series 1: us ob less than 14 weeks with ob transvaginal · 13 of 39 slices shown]
[im 2/39]
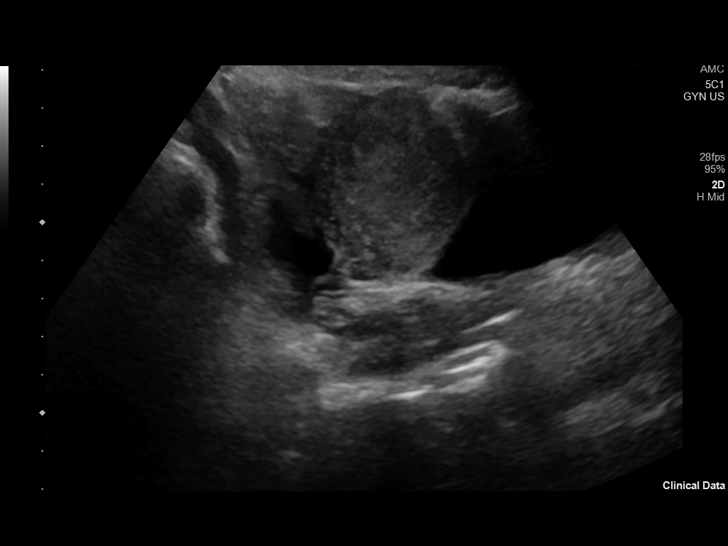
[im 5/39]
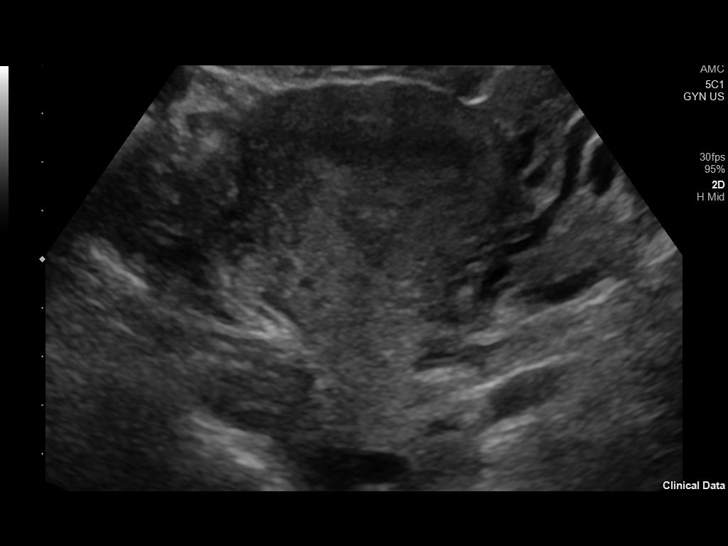
[im 8/39]
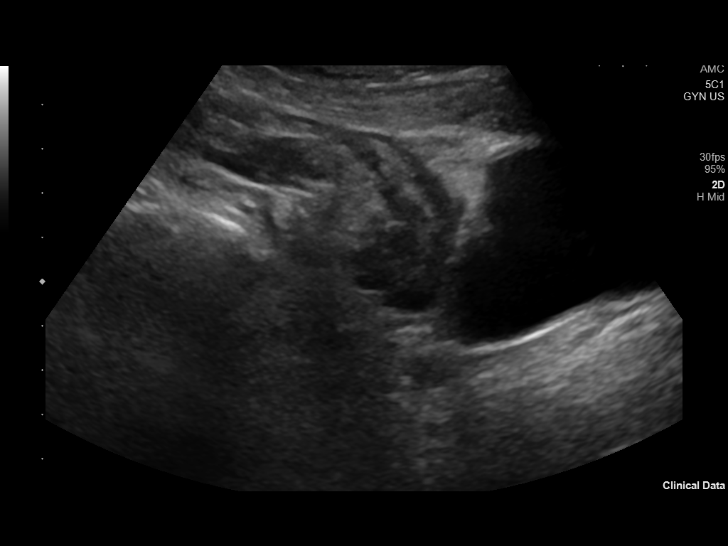
[im 10/39]
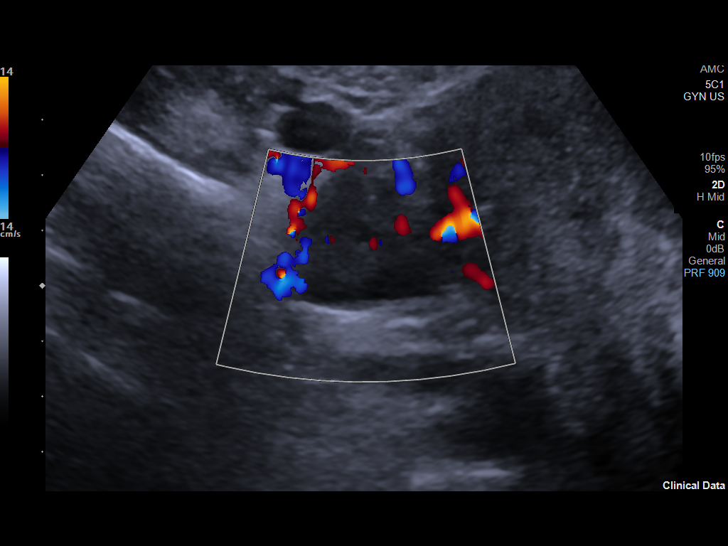
[im 13/39]
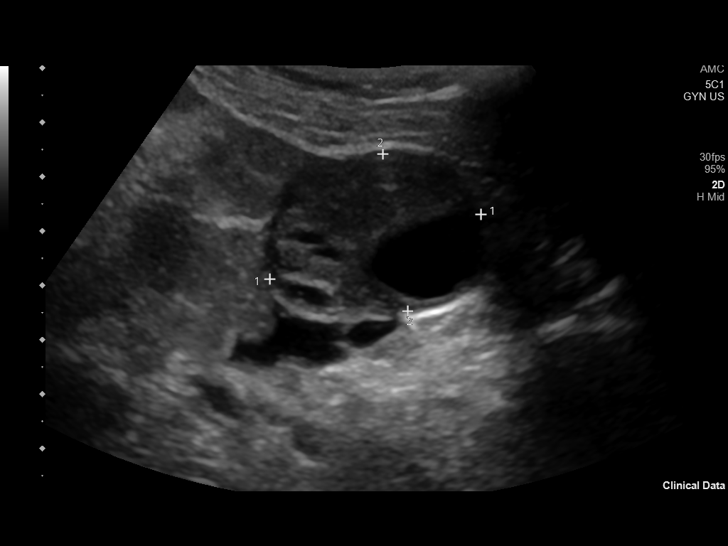
[im 16/39]
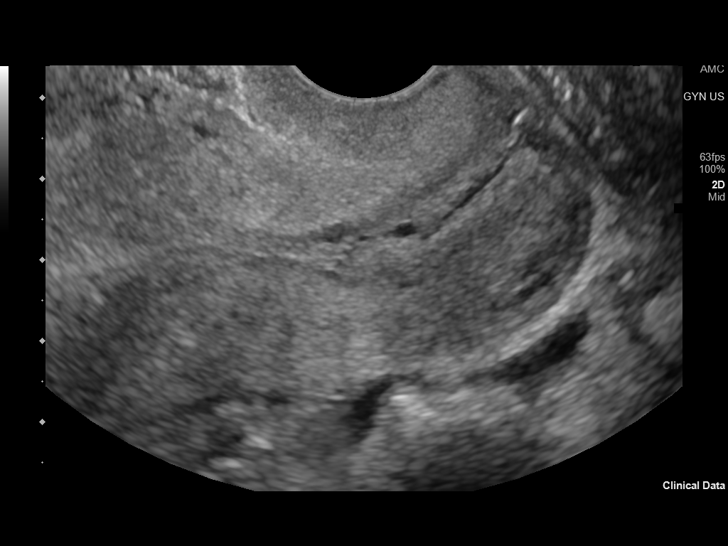
[im 20/39]
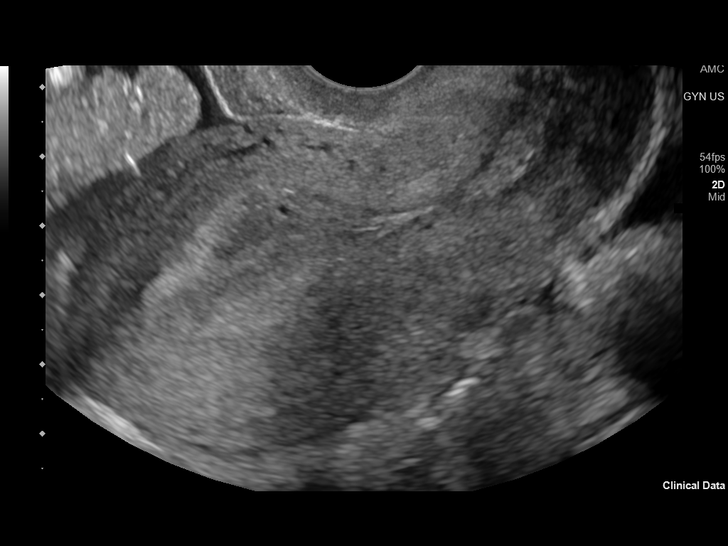
[im 23/39]
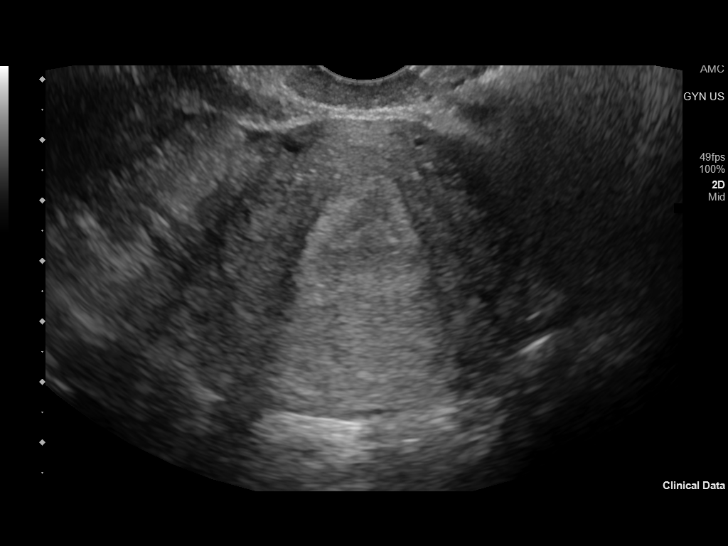
[im 26/39]
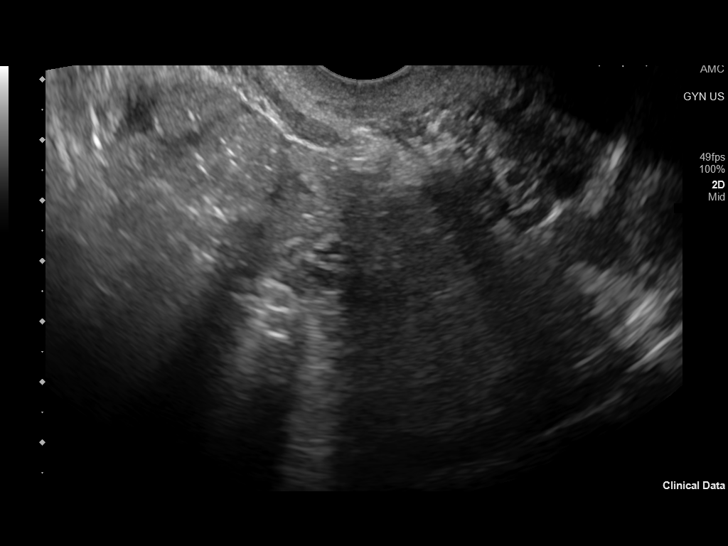
[im 29/39]
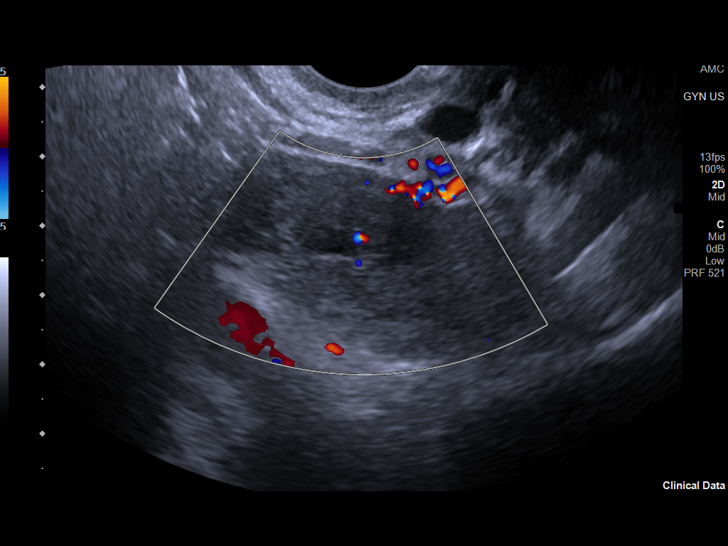
[im 31/39]
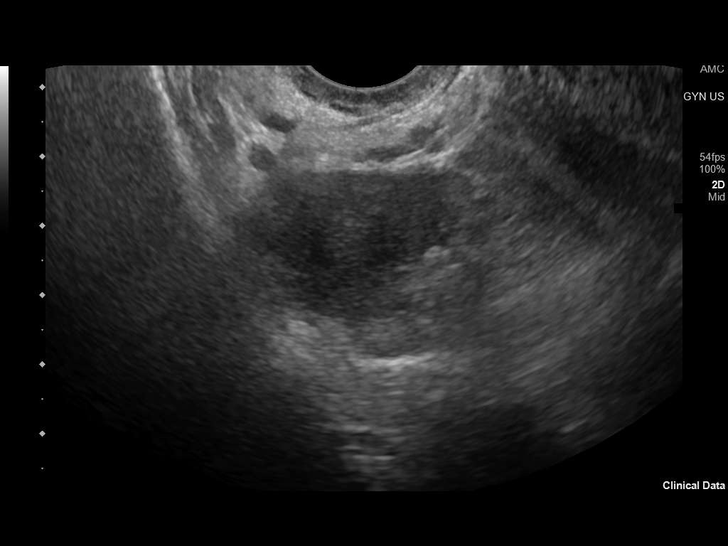
[im 34/39]
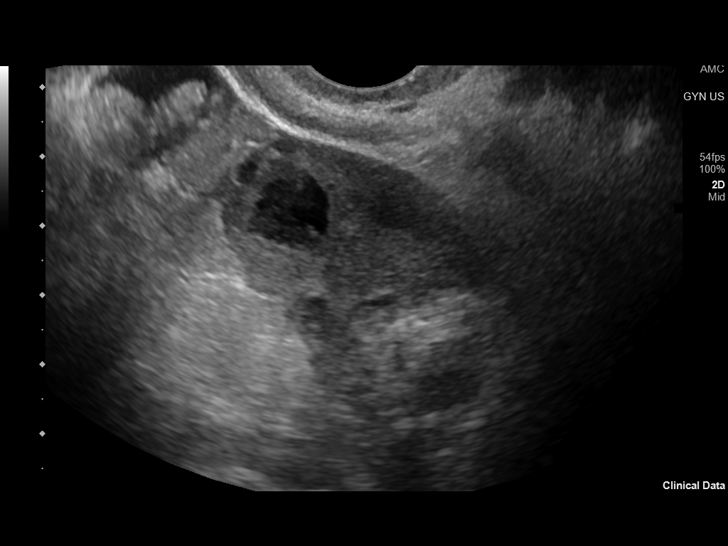
[im 37/39]
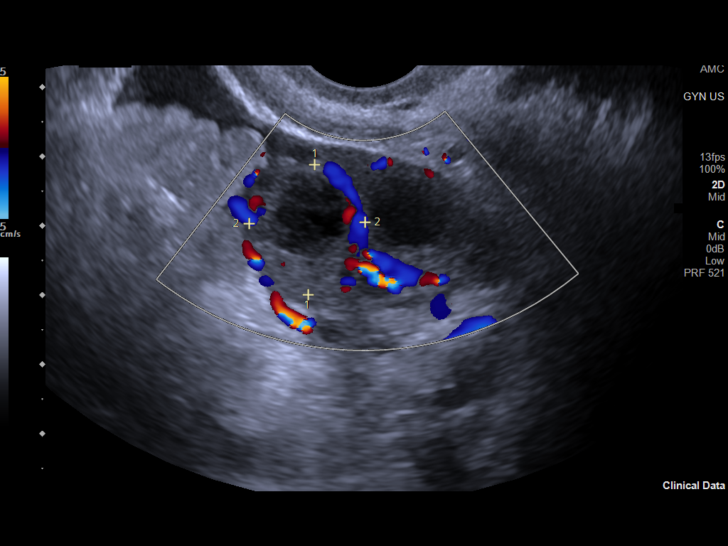

[13 of 28 positions shown; findings below may reference images not displayed]

FINDINGS: Intrauterine gestational sac: None

Yolk sac:  Not Visualized.

Embryo:  Not Visualized.

Maternal uterus/adnexae: Anteverted maternal uterus. Some mild
endometrial thickening is noted which could reflect some early
decidual reaction though no discrete endometrial fluid collection or
intrauterine gestational sac is identified. Normal appearance of the
right ovary. Probable corpus luteum in the left ovary. Additional
anechoic follicles in the left ovary as well. No discrete lesions
suggestive of ectopic is seen within either adnexa. Small volume of
anechoic fluid in the deep pelvis is nonspecific and often
physiologic.
IMPRESSION: No intrauterine pregnancy visualized. Endometrial thickening could
reflect some early decidual reaction. Differential considerations
would include early intrauterine pregnancy too early to visualize,
spontaneous abortion, or occult ectopic pregnancy. Recommend close
clinical followup and serial quantitative beta HCGs and ultrasounds.

Small volume of anechoic free fluid in the deep pelvis is
nonspecific and often physiologic in a reproductive age female.

## 2022-08-12 ENCOUNTER — Ambulatory Visit: Payer: Medicaid Other | Admitting: Physician Assistant

## 2022-08-12 ENCOUNTER — Encounter: Payer: Self-pay | Admitting: Physician Assistant

## 2022-08-12 DIAGNOSIS — Z30013 Encounter for initial prescription of injectable contraceptive: Secondary | ICD-10-CM

## 2022-08-12 DIAGNOSIS — Z3009 Encounter for other general counseling and advice on contraception: Secondary | ICD-10-CM | POA: Diagnosis not present

## 2022-08-12 DIAGNOSIS — O09899 Supervision of other high risk pregnancies, unspecified trimester: Secondary | ICD-10-CM

## 2022-08-12 DIAGNOSIS — Z2839 Other underimmunization status: Secondary | ICD-10-CM | POA: Diagnosis not present

## 2022-08-12 LAB — HEMOGLOBIN, FINGERSTICK: Hemoglobin: 13.6 g/dL (ref 11.1–15.9)

## 2022-08-12 MED ORDER — MEDROXYPROGESTERONE ACETATE 150 MG/ML IM SUSP
150.0000 mg | INTRAMUSCULAR | Status: AC
Start: 2022-08-12 — End: 2023-08-07
  Administered 2022-08-12 – 2022-11-08 (×2): 150 mg via INTRAMUSCULAR

## 2022-08-12 NOTE — Progress Notes (Signed)
Here for PP exam. Delivered SVD on 07/03/2022 at [facility] Desires Depo for BCM. States she is pumping and feeding baby breast milk from bottle due to issues with latching. Counseled to bring baby to [facility] and get some help with latching from breast feeding counselors, if she desires. Burt Knack, RN

## 2022-08-12 NOTE — Progress Notes (Signed)
[facility] Department  Postpartum Exam  Leah Ayers is a 32 y.o. [phone] female who presents for a postpartum visit. She is  [redacted]w[redacted]d   postpartum following a normal spontaneous vaginal delivery.  I have fully reviewed the prenatal and intrapartum course. The delivery was at 38 1/7 gestational weeks.  Anesthesia:  IV narcotics . Postpartum course has been uneventful. Baby is doing well. Baby is feeding by  pumped breastmilk, as baby is having trouble latching on . Bleeding no bleeding. Bowel function is normal. Bladder function is normal. Patient is not sexually active. Contraception method is abstinence. Postpartum depression screening: negative.   The pregnancy intention screening data noted above was reviewed. Potential methods of contraception were discussed. The patient elected to proceed with No data recorded.   Edinburgh Postnatal Depression Scale - 08/12/22 1000       Edinburgh Postnatal Depression Scale:  In the Past 7 Days   I have been able to laugh and see the funny side of things. 0    I have looked forward with enjoyment to things. 0    I have blamed myself unnecessarily when things went wrong. 0    I have been anxious or worried for no good reason. 0    I have felt scared or panicky for no good reason. 0    Things have been getting on top of me. 0    I have been so unhappy that I have had difficulty sleeping. 0    I have felt sad or miserable. 0    I have been so unhappy that I have been crying. 0    The thought of harming myself has occurred to me. 0    Edinburgh Postnatal Depression Scale Total 0             Health Maintenance Due  Topic Date Due   DTaP/Tdap/Td (1 - Tdap) Never done   COVID-19 Vaccine (1 - 2023-24 season) Never done    The following portions of the patient's history were reviewed and updated as appropriate: allergies, current medications, past family history, past medical history, past social history, past surgical history, and  problem list.  Review of Systems A comprehensive review of systems was negative.  Objective:  BP 94/60   Ht 5\' 4"  (1.626 m)   Wt 113 lb (51.3 kg)   LMP  (LMP Unknown) Comment: No period since birth of baby  Breastfeeding Yes   BMI 19.40 kg/m    General:  alert, cooperative, appears stated age, and no distress   Breasts:  Engorged, no erythema  Lungs: clear to auscultation bilaterally  Heart:  regular rate and rhythm, S1, S2 normal, no murmur, click, rub or gallop  Abdomen: soft, non-tender; bowel sounds normal; no masses,  no organomegaly   Wound No wound  GU exam:  normal       Assessment:    1. Family planning services DMPA 150mg  IM every 11-13 weeks for 1 year, beginning today. Counseled re: calcium-rich diet and weight-bearing exercise for bone health. - medroxyPROGESTERone (DEPO-PROVERA) injection 150 mg  2. Postpartum exam Recommend WIC eval for lactation consultation due to trouble with infant latch. Plans to continue breastfeeding til 6 mo. - Hemoglobin, fingerstick  3. Rubella non-immune status, antepartum Given 07/04/22 at hospital post delivery.   Normal postpartum exam.   Plan:   Essential components of care per ACOG recommendations:  1.  Mood and well being: Patient with negative depression screening today. Reviewed local resources  for support.  - Patient tobacco use? No.   - hx of drug use? No.    2. Infant care and feeding:  -Patient currently breastmilk feeding? Yes. Reviewed importance of draining breast regularly to support lactation.  -Social determinants of health (SDOH) reviewed in EPIC. No concerns  3. Sexuality, contraception and birth spacing - Patient does not want a pregnancy in the next year.  Desired family size is 4 children.  - Reviewed reproductive life planning. Reviewed options based on patient desire and reproductive life plan. Patient is interested in Hormonal Injection. This was provided to the patient today.  Risks, benefits,  and typical effectiveness rates were reviewed.  Questions were answered.  Written information was also given to the patient to review.    The patient will follow up in  3 months for surveillance.  The patient was told to call with any further questions, or with any concerns about this method of contraception.  Emphasized use of condoms 100% of the time for STI prevention.  Patient was not offered ECP based on > 120 hours .   Patient was offered ECP. Reviewed options and patient desired No method of ECP, declined all    - Discussed birth spacing of 18 months  4. Sleep and fatigue -Encouraged family/partner/community support of 4 hrs of uninterrupted sleep to help with mood and fatigue  5. Physical Recovery  - Discussed patients delivery and complications. She describes her labor as good. - Patient had a Vaginal, no problems at delivery. Patient had a  no  laceration. Perineal healing reviewed. Patient expressed understanding - Patient has urinary incontinence? No. - Patient is safe to resume physical and sexual activity  6.  Health Maintenance - HM due items addressed No - none due - Last pap smear No results found for: "DIAGPAP" Last Pap 01/19/22 NILM, HPV neg. Pap smear not done at today's visit.  -Breast Cancer screening indicated? No.   7. Chronic Disease/Pregnancy Condition follow up: None  - PCP follow up  Landry Dyke, PA-C [facility] Department

## 2022-08-12 NOTE — Progress Notes (Signed)
Hgb reviewed, no treatment indicated. Depo given, tolerated well, next Depo card given.Burt Knack, RN

## 2022-11-08 ENCOUNTER — Ambulatory Visit: Payer: Medicaid Other | Admitting: Family Medicine

## 2022-11-08 VITALS — BP 104/62 | HR 83 | Ht 64.0 in | Wt 115.6 lb

## 2022-11-08 DIAGNOSIS — Z30013 Encounter for initial prescription of injectable contraceptive: Secondary | ICD-10-CM | POA: Diagnosis not present

## 2022-11-08 DIAGNOSIS — N939 Abnormal uterine and vaginal bleeding, unspecified: Secondary | ICD-10-CM

## 2022-11-08 DIAGNOSIS — Z309 Encounter for contraceptive management, unspecified: Secondary | ICD-10-CM | POA: Diagnosis not present

## 2022-11-08 DIAGNOSIS — Z3042 Encounter for surveillance of injectable contraceptive: Secondary | ICD-10-CM

## 2022-11-08 NOTE — Progress Notes (Signed)
   [facility]  [facility]  Subjective:  Leah Ayers is a 32 y.o. being seen today for   Chief Complaint  Patient presents with   Contraception    Bleeding with depo. Want to change to [facility] pills    HPI   Pt reports that in July she received her first depo shot. Reports that the first month, she bled for 12 days, the second month she bled for 2 weeks, and this 3rd month, she has been bleeding every day. She reports that she liked the depo because it was a shot every 3 months, and she didn't need to remember pills. But she also wants the bleeding to stop.    Health Maintenance Due  Topic Date Due   DTaP/Tdap/Td (1 - Tdap) Never done   INFLUENZA VACCINE  08/19/2022   COVID-19 Vaccine (1 - 2023-24 season) Never done    ROS  The following portions of the patient's history were reviewed and updated as appropriate: allergies, current medications, past family history, past medical history, past social history, past surgical history and problem list. Problem list updated.   See flowsheet for other program required questions.  Objective:   Vitals:   11/08/22 0835  BP: 104/62  Pulse: 83  Weight: 115 lb 9.6 oz (52.4 kg)  Height: 5\' 4"  (1.626 m)    Physical Exam Deferred- done in July   Assessment and Plan:  Leah Ayers is a 32 y.o. female presenting to the [facility] for a [facility] problem visit   1. Vaginal bleeding -discussed with patient that irregular bleeding is common with depo- especially in the first few months -reviewed that the majority of women stop having a period when continuing depo -discussed that OCPs may not be a good method if she is worried she will miss pills -discussed high dose ibuprofen to stop bleeding- pt in agreement with this plan -counseled to take: ibuprofen 800mg  TID x 5 days with first day of period- can start today given she is currently  bleeding -counseled that if she is still having bleeding at her next injection- we can revisit changing BCM- pt agrees with this  2. Encounter for Depo-Provera contraception -ok to give depo today    Return in about 3 months (around 02/08/2023) for depo injection.  No future appointments. Due to language barrier, interpreter Leah Ayers was present for this visit.  Leah Ayers, [place]

## 2022-11-08 NOTE — Progress Notes (Signed)
Pt is here to discuss birth control.  Depo given in L deltoid. Reminder card and condoms given. Gaspar Garbe, RN

## 2023-03-07 ENCOUNTER — Emergency Department: Payer: Medicaid Other

## 2023-03-07 ENCOUNTER — Emergency Department
Admission: EM | Admit: 2023-03-07 | Discharge: 2023-03-07 | Disposition: A | Discharge status: Home / Self Care | Payer: Medicaid Other | Attending: Emergency Medicine | Admitting: Emergency Medicine

## 2023-03-07 DIAGNOSIS — R0789 Other chest pain: Secondary | ICD-10-CM | POA: Diagnosis present

## 2023-03-07 DIAGNOSIS — R072 Precordial pain: Secondary | ICD-10-CM | POA: Diagnosis not present

## 2023-03-07 LAB — CBC
HCT: 39.2 % (ref 36.0–46.0)
Hemoglobin: 12.8 g/dL (ref 12.0–15.0)
MCH: 26.8 pg (ref 26.0–34.0)
MCHC: 32.7 g/dL (ref 30.0–36.0)
MCV: 82 fL (ref 80.0–100.0)
Platelets: 269 10*3/uL (ref 150–400)
RBC: 4.78 MIL/uL (ref 3.87–5.11)
RDW: 14.6 % (ref 11.5–15.5)
WBC: 7.6 10*3/uL (ref 4.0–10.5)
nRBC: 0 % (ref 0.0–0.2)

## 2023-03-07 LAB — BASIC METABOLIC PANEL
Anion gap: 12 (ref 5–15)
BUN: 15 mg/dL (ref 6–20)
CO2: 20 mmol/L — ABNORMAL LOW (ref 22–32)
Calcium: 9.2 mg/dL (ref 8.9–10.3)
Chloride: 105 mmol/L (ref 98–111)
Creatinine, Ser: 0.6 mg/dL (ref 0.44–1.00)
GFR, Estimated: >60 mL/min (ref >60)
Glucose, Bld: 117 mg/dL — ABNORMAL HIGH (ref 70–99)
Potassium: 3.4 mmol/L — ABNORMAL LOW (ref 3.5–5.1)
Sodium: 137 mmol/L (ref 135–145)

## 2023-03-07 LAB — TROPONIN I (HIGH SENSITIVITY): Troponin I (High Sensitivity): 4 ng/L (ref <18)

## 2023-03-07 MED ORDER — KETOROLAC TROMETHAMINE 30 MG/ML IJ SOLN
30.0000 mg | Freq: Once | INTRAMUSCULAR | Status: AC
  Administered 2023-03-07: 30 mg via INTRAMUSCULAR
  Filled 2023-03-07: qty 1

## 2023-03-07 MED ORDER — NAPROXEN 500 MG PO TABS: 500.0000 mg | ORAL_TABLET | Freq: Two times a day (BID) | ORAL | 2 refills | Status: AC

## 2023-03-07 NOTE — ED Triage Notes (Signed)
Pt c/o generalized chest discomfort increasing w/ deep breathing x3 days.  Denies cough and sick contacts.  Denies recent car or plane travel.  Denies n/v/d.

## 2023-03-07 NOTE — ED Provider Notes (Signed)
[facility] Provider Note    Event Date/Time   First MD Initiated Contact with Patient 03/07/23 2001     (approximate)   History   Chest Pain  Spanish interpreter used HPI  Leah Ayers is a 33 y.o. female who presents with 3 days of central chest discomfort, she denies shortness of breath does seem to worsen slightly when she takes a deep breath.  No injury to the area.  No recent trauma.  No fevers chills or cough       Physical Exam   Triage Vital Signs: ED Triage Vitals  Encounter Vitals Group     BP 03/07/23 1602 110/70     Systolic BP Percentile --      Diastolic BP Percentile --      Pulse Rate 03/07/23 1602 82     Resp 03/07/23 1602 16     Temp 03/07/23 1602 98.3 F (36.8 C)     Temp Source 03/07/23 1602 Oral     SpO2 03/07/23 1602 100 %     Weight 03/07/23 1602 54.4 kg (120 lb)     Height 03/07/23 1602 1.626 m (5\' 4" )     Head Circumference --      Peak Flow --      Pain Score 03/07/23 1611 5     Pain Loc --      Pain Education --      Exclude from Growth Chart --     Most recent vital signs: Vitals:   03/07/23 1602  BP: 110/70  Pulse: 82  Resp: 16  Temp: 98.3 F (36.8 C)  SpO2: 100%     General: Awake, no distress.  CV:  Good peripheral perfusion.  Mild chest wall tenderness along the sternal border, no rash Resp:  Normal effort.  Clear auscultation bilaterally Abd:  No distention.  Other:  No calf pain or swelling   ED Results / Procedures / Treatments   Labs (all labs ordered are listed, but only abnormal results are displayed) Labs Reviewed  BASIC METABOLIC PANEL - Abnormal; Notable for the following components:      Result Value   Potassium 3.4 (*)    CO2 20 (*)    Glucose, Bld 117 (*)    All other components within normal limits  CBC  POC URINE PREG, ED  TROPONIN I (HIGH SENSITIVITY)     EKG  ED ECG REPORT I, Jene Every, the attending physician, personally viewed and interpreted this  ECG.  Date: 03/07/2023  Rhythm: normal sinus rhythm QRS Axis: normal Intervals: normal ST/T Wave abnormalities: normal Narrative Interpretation: no evidence of acute ischemia    RADIOLOGY Chest x-ray viewed interpret by me, no acute abnormality    PROCEDURES:  Critical Care performed:   Procedures   MEDICATIONS ORDERED IN ED: Medications  ketorolac (TORADOL) 30 MG/ML injection 30 mg (30 mg Intramuscular Given 03/07/23 2052)     IMPRESSION / MDM / ASSESSMENT AND PLAN / ED COURSE  I reviewed the triage vital signs and the nursing notes. Patient's presentation is most consistent with acute presentation with potential threat to life or bodily function.  Patient presents with chest pain as detailed above, differential includes chest wall pain, less likely pneumonia, doubt ACS, not consistent with PE, PERC negative  Lab work reviewed and is quite reassuring, normal high sensitive troponin, EKG is unremarkable, chest x-ray without evidence of pneumonia or pneumothorax  Given tenderness to chest wall suspect costochondritis versus chest  wall pain, will treat with NSAIDs, IM Toradol given here with improvement.  Outpatient follow-up recommended, return precautions discussed        FINAL CLINICAL IMPRESSION(S) / ED DIAGNOSES   Final diagnoses:  Atypical chest pain     Rx / DC Orders   ED Discharge Orders          Ordered    naproxen (NAPROSYN) 500 MG tablet  2 times daily with meals        03/07/23 2047             Note:  This document was prepared using Dragon voice recognition software and may include unintentional dictation errors.   Jene Every, MD 03/07/23 2151

## 2023-03-07 NOTE — ED Provider Triage Note (Signed)
Emergency Medicine Provider Triage Evaluation Note  Leah Ayers , a 33 y.o. female  was evaluated in triage.  Pt complains of chest pain with deep breath x 3 days. No recent URI or fever. No history of DVT or PE. She uses DepoProvera for birth control. No smoking history. No extended travel.  Physical Exam  BP 110/70 (BP Location: Left Arm)   Pulse 82   Temp 98.3 F (36.8 C) (Oral)   Resp 16   Ht 5\' 4"  (1.626 m)   Wt 54.4 kg   SpO2 100%   BMI 20.60 kg/m  Gen:   Awake, no distress   Resp:  Normal effort  MSK:   Moves extremities without difficulty  Other:    Medical Decision Making  Medically screening exam initiated at 4:12 PM.  Appropriate orders placed.  Briony Parveen was informed that the remainder of the evaluation will be completed by another provider, this initial triage assessment does not replace that evaluation, and the importance of remaining in the ED until their evaluation is complete.    Chinita Pester, FNP 03/07/23 1614

## 2023-08-30 ENCOUNTER — Ambulatory Visit

## 2023-08-30 VITALS — BP 103/65 | HR 75 | Ht 64.5 in | Wt 115.2 lb

## 2023-08-30 DIAGNOSIS — Z30016 Encounter for initial prescription of transdermal patch hormonal contraceptive device: Secondary | ICD-10-CM

## 2023-08-30 DIAGNOSIS — Z309 Encounter for contraceptive management, unspecified: Secondary | ICD-10-CM | POA: Diagnosis not present

## 2023-08-30 DIAGNOSIS — Z3009 Encounter for other general counseling and advice on contraception: Secondary | ICD-10-CM

## 2023-08-30 DIAGNOSIS — Z113 Encounter for screening for infections with a predominantly sexual mode of transmission: Secondary | ICD-10-CM

## 2023-08-30 LAB — WET PREP FOR TRICH, YEAST, CLUE
Clue Cell Exam: POSITIVE — AB
Trichomonas Exam: NEGATIVE
Yeast Exam: NEGATIVE

## 2023-08-30 MED ORDER — NORELGESTROMIN-ETH ESTRADIOL 150-35 MCG/24HR TD PTWK: 1.0000 | MEDICATED_PATCH | TRANSDERMAL | Status: DC

## 2023-08-30 MED ORDER — NORELGESTROMIN-ETH ESTRADIOL 150-35 MCG/24HR TD PTWK: 1.0000 | MEDICATED_PATCH | TRANSDERMAL | Status: AC

## 2023-08-30 NOTE — Progress Notes (Signed)
 Pt here for annual physical and birth control.  Wet mount results reviewed.  No treatment needed at this time.  Xulane (generic) Patches #3 boxes dispensed to patient.  Counseled on use of patch and provided with written information in Spanish.  Pt verbalizes understanding.  Condoms given.-Glynis Hunsucker, RN

## 2023-08-30 NOTE — Progress Notes (Signed)
 [employer] [facility] [address], [address] [place] [address] Main phone: [phone]  Family Planning Visit - Repeat Yearly Visit  Subjective:  Leah Ayers is a 33 y.o. [id number]  being seen today for an annual wellness visit and to discuss contraception options. The patient is currently using female condom for pregnancy prevention. Patient does not want a pregnancy in the next year.   Patient reports they are looking for a method with the following characteristics:  Method they can control starting and stopping  Patient has the following medical problems:  There are no active problems to display for this patient.  Chief Complaint  Patient presents with   Annual Exam    Physical-condoms for birth control   HPI Patient reports desire for contraception. She had depo last October and had constant bleeding for months. She then has had some irregular periods last few months. Did not have any sex for months as a result. Last sex was 2 days ago and that was the first sex in many months. She used a condom with that sexual encounter. Offered ECP but patient declined.  Review of Systems  All other systems reviewed and are negative.  See flowsheet for further details and programmatic requirements Hyperlink available at the top of the signed note in blue.  Flow sheet content below:  Pregnancy Intention Screening Does the patient want to become pregnant in the next year?: No Does the patient's partner want to become pregnant in the next year?: No Would the patient like to discuss contraceptive options today?: Yes Other:  Password: [id number] Is it okay to contact you by mail?: Yes Difficulty accessing hygiene products (feminine products/soap) in the last 3 months: No Contraception History Past methods of contraception used by patient:: Female Condom, Hormonal Injection Adverse effects associated with Hormonal Injection: bleeding Adverse  effects associated with Female Condom: none Sexual History What age did you start your period?: 12 How often do you have your period?: monthly Date of last sex?:  (2-3 days ago with condom) Has the patient had unprotected sex within the last 5 days?: No Do you have sex with men, women, both men and women?: Men only In the past 2 months how many partners have you had sex with?: 1 In the past 12 months, how many partners have you had sex with?: 1 Is it possible that any of your sex partners in the past 12 months had sex with someone else whild they were still in a sexual relationship with you?: No What ways do you have sex?: Vaginal, Oral Do you or your partner use condoms and/or dental dams every time you have vaginal, oral or anal sex?: Yes Do you douche?: No Date of last HIV test?: 04/27/22 Have you ever had an STD?: No Have any of your partners had an STD?: I don't know Have you or your partner ever shot up drugs?: No Have any of your partners used drugs in the past?: No Have you or your partners exchanged money or drugs for sex?: No  Diabetes screening This patient is 33 y.o. with a BMI of Body mass index is 19.47 kg/m.SABRA  Is patient eligible for diabetes screening (age >35 and BMI >25)?  no  Was Hgb A1c ordered? no  STI screening Patient reports 1 of partners in last year.  Does this patient desire STI screening?  Yes  Cervical Cancer Screening  Result Date Procedure Results Follow-ups  01/19/2022 IGP, Aptima HPV DIAGNOSIS::  Comment Specimen adequacy:: Comment Clinician Provided ICD10: Comment Performed by:: Comment PAP Smear Comment: . Note:: Comment Test Methodology: Comment HPV Aptima: Negative     Health Maintenance Due  Topic Date Due   DTaP/Tdap/Td (1 - Tdap) Never done   Hepatitis B Vaccines (1 of 3 - 19+ 3-dose series) Never done   HPV VACCINES (1 - 3-dose SCDM series) Never done   COVID-19 Vaccine (1 - 2024-25 season) Never done   INFLUENZA VACCINE  08/19/2023     The following portions of the patient's history were reviewed and updated as appropriate: allergies, current medications, past family history, past medical history, past social history, past surgical history and problem list. Problem list updated.  Objective:   Vitals:   08/30/23 1332  BP: 103/65  Pulse: 75  Weight: 115 lb 3.2 oz (52.3 kg)  Height: 5' 4.5 (1.638 m)   Physical Exam Vitals and nursing note reviewed. Exam conducted with a chaperone present Kemp T).  Constitutional:      Appearance: Normal appearance.  HENT:     Head: Normocephalic and atraumatic.     Mouth/Throat:     Mouth: Mucous membranes are moist.  Pulmonary:     Effort: Pulmonary effort is normal.  Abdominal:     General: Abdomen is flat.  Genitourinary:    General: Normal vulva.     Exam position: Lithotomy position.     Pubic Area: No rash or pubic lice.      Labia:        Right: No rash or lesion.        Left: No rash or lesion.      Vagina: Normal. No vaginal discharge, erythema, bleeding or lesions.     Cervix: No discharge, friability, lesion or erythema.     Comments: pH = <4.5 Lymphadenopathy:     Head:     Right side of head: No preauricular or posterior auricular adenopathy.     Left side of head: No preauricular or posterior auricular adenopathy.     Cervical: No cervical adenopathy.     Upper Body:     Right upper body: No supraclavicular, axillary or epitrochlear adenopathy.     Left upper body: No supraclavicular, axillary or epitrochlear adenopathy.     Lower Body: No right inguinal adenopathy. No left inguinal adenopathy.  Skin:    General: Skin is warm and dry.     Findings: No rash.  Neurological:     Mental Status: She is alert and oriented to person, place, and time.    Assessment and Plan:  Leah Ayers is a 33 y.o. female [phone] presenting to the [facility] for an yearly wellness and contraception visit  Family planning Contraception  counseling:  Reviewed options based on patient desire and reproductive life plan. Patient is interested in Contraceptive Patch. This was provided to the patient today.  Risks, benefits, and typical effectiveness rates were reviewed.  Questions were answered.  Written information was also given to the patient to review.    The patient will follow up in  3 months for surveillance.  The patient was told to call with any further questions, or with any concerns about this method of contraception.  Emphasized use of condoms 100% of the time for STI prevention.  Emergency Contraception Precautions (ECP): Patient assessed for need of ECP. She is not a candidate based on barrier method used 100% correctly during intercourse per patient's confident report.  Educated on ECP and  reviewed option to have ECP despite condom use and initiation of contraception. Patient declined.  2. Screening for venereal disease (Primary)  - WET PREP FOR TRICH, YEAST, CLUE positive for clue cells and amine, but no bothersome symptoms and pH normal - Chlamydia/Gonorrhea [facility]   3. Encounter for initial prescription of transdermal patch hormonal contraceptive device  - No contraindications for estrogen - norelgestromin -ethinyl estradiol  (XULANE) 150-35 MCG/24HR transdermal patch; Place 1 patch onto the skin once a week.  - Follow up in 3 months regarding satisfaction   Return in about 3 months (around 11/30/2023).  No future appointments.  Damien FORBES Satchel, NP  Due to language barrier, a Spanish interpreter Kemp T.) was present in person during the history-taking, subsequent discussion, and physical exam with this patient.
